# Patient Record
Sex: Female | Born: 1985 | Race: White | Hispanic: No | Marital: Married | Smoking: Never smoker
Health system: Southern US, Community
[De-identification: ages and names within clinical notes are randomized; demographics above are authoritative.]

## PROBLEM LIST (undated history)

## (undated) DIAGNOSIS — J309 Allergic rhinitis, unspecified: Secondary | ICD-10-CM

## (undated) DIAGNOSIS — E282 Polycystic ovarian syndrome: Secondary | ICD-10-CM

## (undated) DIAGNOSIS — H1010 Acute atopic conjunctivitis, unspecified eye: Secondary | ICD-10-CM

## (undated) DIAGNOSIS — J45909 Unspecified asthma, uncomplicated: Secondary | ICD-10-CM

## (undated) DIAGNOSIS — G43909 Migraine, unspecified, not intractable, without status migrainosus: Secondary | ICD-10-CM

## (undated) DIAGNOSIS — E079 Disorder of thyroid, unspecified: Secondary | ICD-10-CM

## (undated) HISTORY — DX: Allergic rhinitis, unspecified: J30.9

## (undated) HISTORY — PX: OTHER SURGICAL HISTORY: SHX169

## (undated) HISTORY — DX: Polycystic ovarian syndrome: E28.2

## (undated) HISTORY — DX: Unspecified asthma, uncomplicated: J45.909

## (undated) HISTORY — PX: WISDOM TOOTH EXTRACTION: SHX21

## (undated) HISTORY — DX: Migraine, unspecified, not intractable, without status migrainosus: G43.909

## (undated) HISTORY — DX: Acute atopic conjunctivitis, unspecified eye: H10.10

## (undated) HISTORY — DX: Disorder of thyroid, unspecified: E07.9

---

## 2015-04-08 ENCOUNTER — Ambulatory Visit: Payer: Self-pay | Admitting: Family Medicine

## 2016-04-01 DIAGNOSIS — Z01419 Encounter for gynecological examination (general) (routine) without abnormal findings: Secondary | ICD-10-CM | POA: Diagnosis not present

## 2016-04-20 ENCOUNTER — Encounter: Payer: Self-pay | Admitting: Allergy and Immunology

## 2016-04-20 ENCOUNTER — Ambulatory Visit (INDEPENDENT_AMBULATORY_CARE_PROVIDER_SITE_OTHER): Payer: BLUE CROSS/BLUE SHIELD | Admitting: Allergy and Immunology

## 2016-04-20 VITALS — BP 138/96 | HR 80 | Resp 18 | Ht 62.99 in | Wt 244.7 lb

## 2016-04-20 DIAGNOSIS — J309 Allergic rhinitis, unspecified: Secondary | ICD-10-CM | POA: Diagnosis not present

## 2016-04-20 DIAGNOSIS — H1010 Acute atopic conjunctivitis, unspecified eye: Secondary | ICD-10-CM

## 2016-04-20 DIAGNOSIS — J4540 Moderate persistent asthma, uncomplicated: Secondary | ICD-10-CM | POA: Diagnosis not present

## 2016-04-20 MED ORDER — MONTELUKAST SODIUM 10 MG PO TABS: 10.0000 mg | ORAL_TABLET | Freq: Every day | ORAL | Status: DC

## 2016-04-20 MED ORDER — BUDESONIDE-FORMOTEROL FUMARATE 160-4.5 MCG/ACT IN AERO: INHALATION_SPRAY | RESPIRATORY_TRACT | Status: DC

## 2016-04-20 MED ORDER — ALBUTEROL SULFATE 108 (90 BASE) MCG/ACT IN AEPB: 2.0000 | INHALATION_SPRAY | RESPIRATORY_TRACT | Status: DC | PRN

## 2016-04-20 NOTE — Patient Instructions (Addendum)
  1. Continue Symbicort 160 2 inhalations 1-2 times/ day depending on disease activity  2. Continue Flonase 1 spray each nostril 1-2 times/ day depending on disease activity  3. Continue montelukast 10 mg daily  4. Continue over-the-counter antihistamine, alaway eyedrops, and pro-air respiclick if needed  5. Return to clinic in 1 year or earlier if problem

## 2016-04-20 NOTE — Progress Notes (Signed)
Follow-up Note  Referring Provider: No ref. provider found Primary Provider: Frederich Chick, MD Date of Office Visit: 04/20/2016  Subjective:   Katherine Crawford (DOB: 31-Jul-1986) is a 30 y.o. female who [facility] in re-evaluation of the following:  HPI: Tanisa returns to this clinic in reevaluation of her asthma and allergic rhinoconjunctivitis. It is been approximately 1 year since I've seen her in his clinic.  Her asthma is been doing quite well although she's noticed a little bit more activity with some wheezing and coughing and having use of bronchodilator less than 1 time per week since the spring has arrived. Unfortunately, she ran out of her Symbicort recently. Her nose is been doing okay as long she continues to use Flonase and montelukast on a pretty consistent basis. She does use over-the-counter eyedrops and over-the-counter antihistamines and her entire collection of medical therapy appears to be resulting in adequate control of her atopic disease as she goes through the year. She has not required any systemic steroids to treat this condition nor any type of urgent care evaluation.     Medication List           ALAWAY OP  Apply 1 drop to eye daily.     Albuterol Sulfate 108 (90 Base) MCG/ACT Aepb  Commonly known as:  PROAIR RESPICLICK  Inhale 2 Doses into the lungs as needed. Every 4 to 6 hours for cough or wheeze.     ALLEGRA PO  Take by mouth daily.     budesonide-formoterol 160-4.5 MCG/ACT inhaler  Commonly known as:  SYMBICORT  Inhale two puffs twice daily to prevent cough or wheeze.  Rinse, gargle, and spit after use.     fluticasone 50 MCG/ACT nasal spray  Commonly known as:  FLONASE  Place 2 sprays into both nostrils daily.     montelukast 10 MG tablet  Commonly known as:  SINGULAIR  Take 1 tablet (10 mg total) by mouth daily.     multivitamin tablet  Take 1 tablet by mouth daily.     OCELLA 3-0.03 MG  tablet  Generic drug:  drospirenone-ethinyl estradiol  Take 1 tablet by mouth daily.     SUMATRIPTAN SUCCINATE PO  Take by mouth as needed.        Past Medical History  Diagnosis Date  . Asthma   . Allergic rhinoconjunctivitis   . Migraines     Past Surgical History  Procedure Laterality Date  . Wisdom tooth extraction      No Known Allergies  Review of systems negative except as noted in HPI / PMHx or noted below:  Review of Systems  Constitutional: Negative.   HENT: Negative.   Eyes: Negative.   Respiratory: Negative.   Cardiovascular: Negative.   Gastrointestinal: Negative.   Genitourinary: Negative.   Musculoskeletal: Negative.   Skin: Negative.   Neurological: Negative.   Endo/Heme/Allergies: Negative.   Psychiatric/Behavioral: Negative.      Objective:   Filed Vitals:   04/20/16 1810  BP: 138/96  Pulse: 80  Resp: 18   Height: 5' 2.99" (160 cm)  Weight: 244 lb 11.4 oz (111 kg)   Physical Exam  Constitutional: She is well-developed, well-nourished, and in no distress.  HENT:  Head: Normocephalic.  Right Ear: Tympanic membrane, external ear and ear canal normal.  Left Ear: Tympanic membrane, external ear and ear canal normal.  Nose: Nose normal. No mucosal edema or rhinorrhea.  Mouth/Throat: Uvula is  midline, oropharynx is clear and moist and mucous membranes are normal. No oropharyngeal exudate.  Eyes: Conjunctivae are normal.  Neck: Trachea normal. No tracheal tenderness present. No tracheal deviation present. No thyromegaly present.  Cardiovascular: Normal rate, regular rhythm, S1 normal, S2 normal and normal heart sounds.   No murmur heard. Pulmonary/Chest: Breath sounds normal. No stridor. No respiratory distress. She has no wheezes. She has no rales.  Musculoskeletal: She exhibits no edema.  Lymphadenopathy:       Head (right side): No tonsillar adenopathy present.       Head (left side): No tonsillar adenopathy present.    She has no  cervical adenopathy.  Neurological: She is alert. Gait normal.  Skin: No rash noted. She is not diaphoretic. No erythema. Nails show no clubbing.  Psychiatric: Mood and affect normal.    Diagnostics:    Spirometry was performed and demonstrated an FEV1 of 2.85 at 91 % of predicted.  The patient had an Asthma Control Test with the following results: ACT Total Score: 13.    Assessment and Plan:   1. Asthma, moderate persistent, well-controlled   2. Allergic rhinoconjunctivitis     1. Continue Symbicort 160 2 inhalations 1-2 times/ day depending on disease activity  2. Continue Flonase 1 spray each nostril 1-2 times/ day depending on disease activity  3. Continue montelukast 10 mg daily  4. Continue over-the-counter antihistamine, alaway eyedrops, and pro-air respiclick if needed  5. Return to clinic in 1 year or earlier if problem  Diannia RuderKara appears to be doing well on her medical plan and I see no need for changing this plan at this point in time. She is a very good understanding of her disease state and understands when it is appropriate to use specific medications and I will see her back in this clinic in approximately one year or earlier if there is a problem with this plan.  Laurette SchimkeEric Kozlow, MD [facility]

## 2016-06-07 DIAGNOSIS — R202 Paresthesia of skin: Secondary | ICD-10-CM | POA: Diagnosis not present

## 2016-06-07 DIAGNOSIS — Z7189 Other specified counseling: Secondary | ICD-10-CM | POA: Diagnosis not present

## 2016-08-09 DIAGNOSIS — Z3041 Encounter for surveillance of contraceptive pills: Secondary | ICD-10-CM | POA: Diagnosis not present

## 2016-08-09 DIAGNOSIS — G43709 Chronic migraine without aura, not intractable, without status migrainosus: Secondary | ICD-10-CM | POA: Diagnosis not present

## 2016-11-26 DIAGNOSIS — N898 Other specified noninflammatory disorders of vagina: Secondary | ICD-10-CM | POA: Diagnosis not present

## 2016-11-26 DIAGNOSIS — Z0000 Encounter for general adult medical examination without abnormal findings: Secondary | ICD-10-CM | POA: Diagnosis not present

## 2017-04-15 ENCOUNTER — Other Ambulatory Visit: Payer: Self-pay | Admitting: Obstetrics & Gynecology

## 2017-04-15 ENCOUNTER — Other Ambulatory Visit (HOSPITAL_COMMUNITY)
Admission: RE | Admit: 2017-04-15 | Discharge: 2017-04-15 | Disposition: A | Discharge status: Home / Self Care | Payer: BLUE CROSS/BLUE SHIELD | Source: Ambulatory Visit | Attending: Obstetrics and Gynecology | Admitting: Obstetrics and Gynecology

## 2017-04-15 DIAGNOSIS — Z01419 Encounter for gynecological examination (general) (routine) without abnormal findings: Secondary | ICD-10-CM | POA: Diagnosis not present

## 2017-04-15 DIAGNOSIS — Z01411 Encounter for gynecological examination (general) (routine) with abnormal findings: Secondary | ICD-10-CM | POA: Diagnosis not present

## 2017-04-18 LAB — CYTOLOGY - PAP: DIAGNOSIS: NEGATIVE

## 2017-08-11 DIAGNOSIS — L700 Acne vulgaris: Secondary | ICD-10-CM | POA: Diagnosis not present

## 2017-08-11 DIAGNOSIS — D220 Melanocytic nevi of lip: Secondary | ICD-10-CM | POA: Diagnosis not present

## 2017-08-11 DIAGNOSIS — L219 Seborrheic dermatitis, unspecified: Secondary | ICD-10-CM | POA: Diagnosis not present

## 2017-08-11 DIAGNOSIS — D2262 Melanocytic nevi of left upper limb, including shoulder: Secondary | ICD-10-CM | POA: Diagnosis not present

## 2017-09-28 DIAGNOSIS — S46811A Strain of other muscles, fascia and tendons at shoulder and upper arm level, right arm, initial encounter: Secondary | ICD-10-CM | POA: Diagnosis not present

## 2017-10-26 ENCOUNTER — Ambulatory Visit: Payer: BLUE CROSS/BLUE SHIELD | Admitting: Podiatry

## 2017-10-26 ENCOUNTER — Encounter: Payer: Self-pay | Admitting: Podiatry

## 2017-10-26 ENCOUNTER — Ambulatory Visit (INDEPENDENT_AMBULATORY_CARE_PROVIDER_SITE_OTHER): Payer: BLUE CROSS/BLUE SHIELD

## 2017-10-26 ENCOUNTER — Ambulatory Visit: Payer: Self-pay

## 2017-10-26 VITALS — BP 118/85 | HR 98 | Ht 64.0 in | Wt 250.0 lb

## 2017-10-26 DIAGNOSIS — M659 Synovitis and tenosynovitis, unspecified: Secondary | ICD-10-CM

## 2017-10-26 DIAGNOSIS — R269 Unspecified abnormalities of gait and mobility: Secondary | ICD-10-CM | POA: Diagnosis not present

## 2017-10-26 DIAGNOSIS — S93622S Sprain of tarsometatarsal ligament of left foot, sequela: Secondary | ICD-10-CM | POA: Diagnosis not present

## 2017-10-26 MED ORDER — MELOXICAM 15 MG PO TABS: 15.0000 mg | ORAL_TABLET | Freq: Every day | ORAL | 0 refills | Status: DC

## 2017-10-26 NOTE — Progress Notes (Signed)
Subjective:    Patient ID: Katherine Crawford, female    DOB: Dec 17, 1986, 31 y.o.   MRN: [id number]  HPI  Chief Complaint  Patient presents with  . Foot Pain    left foot x 3 weeks - pain is across top of foot - worse walking up hill or up stairs. Pain is tight , like someone is squeezing her foot.Right foot has started hurting the last few days, heel and achilles tendon area. Likely due to they way she is walking because of the left foot pain   31 y.o. female presents with the above complaint.  Ports pain across the top of left foot states that it is worse when walking up a hill or stairs.  Denies known injury.   Past Medical History:  Diagnosis Date  . Allergic rhinoconjunctivitis   . Asthma   . Migraines    Past Surgical History:  Procedure Laterality Date  . WISDOM TOOTH EXTRACTION      Current Outpatient Medications:  .  ACZONE 7.5 % GEL, APPLY TO THE AFFECTED AREA EVERY DAY, Disp: , Rfl: 3 .  Albuterol Sulfate (PROAIR RESPICLICK) 108 (90 Base) MCG/ACT AEPB, Inhale 2 Doses into the lungs as needed. Every 4 to 6 hours for cough or wheeze., Disp: 3 each, Rfl: 0 .  budesonide-formoterol (SYMBICORT) 160-4.5 MCG/ACT inhaler, Inhale two puffs twice daily to prevent cough or wheeze.  Rinse, gargle, and spit after use., Disp: 3 Inhaler, Rfl: 1 .  Fexofenadine HCl (ALLEGRA PO), Take by mouth daily., Disp: , Rfl:  .  fluticasone (FLONASE) 50 MCG/ACT nasal spray, Place 2 sprays into both nostrils daily., Disp: , Rfl:  .  ketoconazole (NIZORAL) 2 % shampoo, , Disp: , Rfl:  .  Ketotifen Fumarate (ALAWAY OP), Apply 1 drop to eye daily., Disp: , Rfl:  .  levonorgestrel-ethinyl estradiol (SEASONALE,INTROVALE,JOLESSA) 0.15-0.03 MG tablet, TAKE 1 TABLET BY MOUTH EVERY DAY FOR 11 WEEKS, THEN 1 WEEK OFF, Disp: , Rfl: 4 .  meloxicam (MOBIC) 15 MG tablet, Take 1 tablet (15 mg total) daily by mouth., Disp: 30 tablet, Rfl: 0 .  montelukast (SINGULAIR) 10 MG tablet, Take 1 tablet (10 mg total) by mouth  daily., Disp: 90 tablet, Rfl: 1 .  Multiple Vitamin (MULTIVITAMIN) tablet, Take 1 tablet by mouth daily., Disp: , Rfl:  .  OCELLA 3-0.03 MG tablet, Take 1 tablet by mouth daily. , Disp: , Rfl:  .  SUMATRIPTAN SUCCINATE PO, Take by mouth as needed., Disp: , Rfl:   No Known Allergies    Review of Systems  All other systems reviewed and are negative.      Objective:   Physical Exam Vitals:   10/26/17 0939  BP: 118/85  Pulse: 98   General AA&O x3. Normal mood and affect.  Vascular Dorsalis pedis and posterior tibial pulses  present 2+ bilaterally  Capillary refill normal to all digits. Pedal hair growth normal.  Neurologic Epicritic sensation grossly present.  Dermatologic No open lesions. Interspaces clear of maceration. Nails well groomed and normal in appearance.  Orthopedic: MMT 5/5 in dorsiflexion, plantarflexion, inversion, and eversion. Normal joint ROM without pain or crepitus. Slight pain to palpation left 2nd/3rd TMT's.  No pain with piano key test of the TMT's   Radiographs taken and reviewed: Questionable evidence of Lisfranc diastases likely not acute    Assessment & Plan:  Patient was evaluated and treated and all questions answered  ?Lisfranc Sprain -X-rays reviewed as above -Will trial Cam boot for protection -Should  pain not approved will consider repeat x-rays

## 2017-10-27 ENCOUNTER — Other Ambulatory Visit: Payer: Self-pay | Admitting: Podiatry

## 2017-10-27 DIAGNOSIS — M659 Synovitis and tenosynovitis, unspecified: Secondary | ICD-10-CM

## 2017-11-09 ENCOUNTER — Ambulatory Visit: Payer: BLUE CROSS/BLUE SHIELD | Admitting: Podiatry

## 2017-11-09 DIAGNOSIS — R269 Unspecified abnormalities of gait and mobility: Secondary | ICD-10-CM | POA: Diagnosis not present

## 2017-11-09 DIAGNOSIS — M25372 Other instability, left ankle: Secondary | ICD-10-CM | POA: Diagnosis not present

## 2017-11-09 DIAGNOSIS — S93622S Sprain of tarsometatarsal ligament of left foot, sequela: Secondary | ICD-10-CM

## 2017-11-09 NOTE — Progress Notes (Signed)
  Subjective:  Patient ID: Katherine PintoKara Bogdan, female    DOB: May 15, 1986,  MRN: [id number]  31 y.o. female for follow-up left foot pain.  Wear the cam boot states that it is somewhat better she is not still at 100%  Objective:  There were no vitals filed for this visit. General AA&O x3. Normal mood and affect.  Vascular Pedal pulses palpable.  Neurologic Epicritic sensation grossly intact.  Dermatologic No open lesions. Skin normal texture and turgor.  Orthopedic: No pain to palpation to L foot   Assessment & Plan:  Patient was evaluated and treated and all questions answered.  ? L Lisfranc/Ankle insufficiency -Improving.  We will transition out of cam boot. -Transition to lace up ASO for protection -Follow-up in 4 weeks for new x-rays

## 2017-11-18 ENCOUNTER — Telehealth: Payer: Self-pay | Admitting: Podiatry

## 2017-11-18 NOTE — Telephone Encounter (Signed)
About a week to a week and a half ago Dr. Samuella CotaPrice took me out of the boot and put me into a brace. My foot feels like it is hurting more now than before. I wanted to know what he would recommend. My number is [phone]. Thank you.

## 2017-11-21 NOTE — Telephone Encounter (Signed)
Spoke with patient informing her to go back into her boot until further evaluation

## 2017-11-21 NOTE — Telephone Encounter (Signed)
Go back in the boot until next eval.

## 2017-11-22 ENCOUNTER — Other Ambulatory Visit: Payer: Self-pay | Admitting: Podiatry

## 2017-11-25 ENCOUNTER — Ambulatory Visit (INDEPENDENT_AMBULATORY_CARE_PROVIDER_SITE_OTHER): Payer: BLUE CROSS/BLUE SHIELD

## 2017-11-25 ENCOUNTER — Ambulatory Visit: Payer: BLUE CROSS/BLUE SHIELD | Admitting: Podiatry

## 2017-11-25 DIAGNOSIS — S93622S Sprain of tarsometatarsal ligament of left foot, sequela: Secondary | ICD-10-CM | POA: Diagnosis not present

## 2017-11-25 MED ORDER — METHYLPREDNISOLONE 4 MG PO TBPK: ORAL_TABLET | ORAL | 0 refills | Status: DC

## 2017-11-25 NOTE — Progress Notes (Signed)
  Subjective:  Patient ID: Katherine Crawford, female    DOB: Mar 08, 1986,  MRN: [id number]  No chief complaint on file.  31 y.o. female returns for follow-up right Lisfranc sprain; states that her pain is improving.  Objective:  There were no vitals filed for this visit. General AA&O x3. Normal mood and affect.  Vascular Pedal pulses palpable.  Neurologic Epicritic sensation grossly intact.  Dermatologic No open lesions. Skin normal texture and turgor.  Orthopedic: No pain to palpation either foot.   Assessment & Plan:  Patient was evaluated and treated and all questions answered.  Right Lisfranc sprain -X-ray is taken and reviewed no worsening Lisfranc diastases -Rx Medrol pack -Patient advised to continue ASO for protection during periods of extended weightbearing.  Otherwise can transition out to patient tolerance  Return in about 6 weeks (around 01/06/2018) for sprain f/u.

## 2017-12-07 ENCOUNTER — Telehealth: Payer: Self-pay | Admitting: *Deleted

## 2017-12-07 DIAGNOSIS — I10 Essential (primary) hypertension: Secondary | ICD-10-CM | POA: Diagnosis not present

## 2017-12-07 DIAGNOSIS — Z23 Encounter for immunization: Secondary | ICD-10-CM | POA: Diagnosis not present

## 2017-12-07 DIAGNOSIS — Z0000 Encounter for general adult medical examination without abnormal findings: Secondary | ICD-10-CM | POA: Diagnosis not present

## 2017-12-07 DIAGNOSIS — Z6841 Body Mass Index (BMI) 40.0 and over, adult: Secondary | ICD-10-CM | POA: Diagnosis not present

## 2017-12-07 DIAGNOSIS — E781 Pure hyperglyceridemia: Secondary | ICD-10-CM | POA: Diagnosis not present

## 2017-12-07 MED ORDER — MELOXICAM 15 MG PO TABS: 15.0000 mg | ORAL_TABLET | Freq: Every day | ORAL | 0 refills | Status: DC

## 2017-12-07 NOTE — Telephone Encounter (Signed)
Received refill request for meloxicam. Dr. Samuella CotaPrice states refill once and pt needs to be seen prior to future refills.

## 2018-01-05 ENCOUNTER — Ambulatory Visit: Payer: BLUE CROSS/BLUE SHIELD | Admitting: Podiatry

## 2018-01-05 DIAGNOSIS — M25372 Other instability, left ankle: Secondary | ICD-10-CM | POA: Diagnosis not present

## 2018-01-05 DIAGNOSIS — S93622S Sprain of tarsometatarsal ligament of left foot, sequela: Secondary | ICD-10-CM

## 2018-01-05 NOTE — Progress Notes (Signed)
  Subjective:  Patient ID: Erma PintoKara Krabbenhoft, female    DOB: 1986-06-26,  MRN: [id number]  No chief complaint on file.  32 y.o. female returns for f/u L foot sprain. Doing well. Able to ambulate in normal shoegear without issue. States she is having no pain but her foot doesn't feel exactly back to normal.  Objective:  There were no vitals filed for this visit. General AA&O x3. Normal mood and affect.  Vascular Pedal pulses palpable.  Neurologic Epicritic sensation grossly intact.  Dermatologic No open lesions. Skin normal texture and turgor.  Orthopedic: No pain to palpation either foot.   Assessment & Plan:  Patient was evaluated and treated and all questions answered.  S/p Lisfranc Pain -No longer having pain. -Advised on strengthening exercises for the L foot. Should issues persist advised we could trial short course of PT. Patient declined at this time.  No Follow-up on file.

## 2018-03-22 DIAGNOSIS — G43009 Migraine without aura, not intractable, without status migrainosus: Secondary | ICD-10-CM | POA: Diagnosis not present

## 2018-04-19 DIAGNOSIS — L700 Acne vulgaris: Secondary | ICD-10-CM | POA: Diagnosis not present

## 2018-04-19 DIAGNOSIS — D1801 Hemangioma of skin and subcutaneous tissue: Secondary | ICD-10-CM | POA: Diagnosis not present

## 2018-04-19 DIAGNOSIS — L814 Other melanin hyperpigmentation: Secondary | ICD-10-CM | POA: Diagnosis not present

## 2018-04-26 DIAGNOSIS — Z3009 Encounter for other general counseling and advice on contraception: Secondary | ICD-10-CM | POA: Diagnosis not present

## 2018-04-26 DIAGNOSIS — Z1379 Encounter for other screening for genetic and chromosomal anomalies: Secondary | ICD-10-CM | POA: Diagnosis not present

## 2018-04-26 DIAGNOSIS — Z01411 Encounter for gynecological examination (general) (routine) with abnormal findings: Secondary | ICD-10-CM | POA: Diagnosis not present

## 2018-04-26 DIAGNOSIS — E282 Polycystic ovarian syndrome: Secondary | ICD-10-CM | POA: Diagnosis not present

## 2018-05-10 DIAGNOSIS — Z0100 Encounter for examination of eyes and vision without abnormal findings: Secondary | ICD-10-CM | POA: Diagnosis not present

## 2018-05-17 DIAGNOSIS — Z31430 Encounter of female for testing for genetic disease carrier status for procreative management: Secondary | ICD-10-CM | POA: Diagnosis not present

## 2018-06-16 ENCOUNTER — Encounter (HOSPITAL_COMMUNITY): Payer: BLUE CROSS/BLUE SHIELD

## 2018-06-30 ENCOUNTER — Encounter (HOSPITAL_COMMUNITY): Payer: BLUE CROSS/BLUE SHIELD

## 2018-07-20 DIAGNOSIS — N979 Female infertility, unspecified: Secondary | ICD-10-CM | POA: Diagnosis not present

## 2018-07-20 DIAGNOSIS — Z6841 Body Mass Index (BMI) 40.0 and over, adult: Secondary | ICD-10-CM | POA: Diagnosis not present

## 2018-07-20 DIAGNOSIS — Z3141 Encounter for fertility testing: Secondary | ICD-10-CM | POA: Diagnosis not present

## 2018-07-21 ENCOUNTER — Ambulatory Visit (HOSPITAL_COMMUNITY): Payer: BLUE CROSS/BLUE SHIELD

## 2018-07-21 ENCOUNTER — Encounter (HOSPITAL_COMMUNITY): Payer: BLUE CROSS/BLUE SHIELD

## 2018-07-28 DIAGNOSIS — K648 Other hemorrhoids: Secondary | ICD-10-CM | POA: Diagnosis not present

## 2018-08-11 DIAGNOSIS — Z3141 Encounter for fertility testing: Secondary | ICD-10-CM | POA: Diagnosis not present

## 2018-08-14 DIAGNOSIS — Z3141 Encounter for fertility testing: Secondary | ICD-10-CM | POA: Diagnosis not present

## 2018-09-11 DIAGNOSIS — Z3141 Encounter for fertility testing: Secondary | ICD-10-CM | POA: Diagnosis not present

## 2018-10-10 DIAGNOSIS — Z3141 Encounter for fertility testing: Secondary | ICD-10-CM | POA: Diagnosis not present

## 2018-11-02 DIAGNOSIS — R300 Dysuria: Secondary | ICD-10-CM | POA: Diagnosis not present

## 2018-11-06 DIAGNOSIS — Z3141 Encounter for fertility testing: Secondary | ICD-10-CM | POA: Diagnosis not present

## 2018-11-08 DIAGNOSIS — N970 Female infertility associated with anovulation: Secondary | ICD-10-CM | POA: Diagnosis not present

## 2018-12-01 DIAGNOSIS — Z3141 Encounter for fertility testing: Secondary | ICD-10-CM | POA: Diagnosis not present

## 2018-12-06 DIAGNOSIS — Z3141 Encounter for fertility testing: Secondary | ICD-10-CM | POA: Diagnosis not present

## 2018-12-08 DIAGNOSIS — Z0000 Encounter for general adult medical examination without abnormal findings: Secondary | ICD-10-CM | POA: Diagnosis not present

## 2018-12-08 DIAGNOSIS — Z3189 Encounter for other procreative management: Secondary | ICD-10-CM | POA: Diagnosis not present

## 2018-12-21 DIAGNOSIS — J111 Influenza due to unidentified influenza virus with other respiratory manifestations: Secondary | ICD-10-CM | POA: Diagnosis not present

## 2018-12-21 DIAGNOSIS — R509 Fever, unspecified: Secondary | ICD-10-CM | POA: Diagnosis not present

## 2019-02-14 DIAGNOSIS — Z3141 Encounter for fertility testing: Secondary | ICD-10-CM | POA: Diagnosis not present

## 2019-02-16 DIAGNOSIS — Z3189 Encounter for other procreative management: Secondary | ICD-10-CM | POA: Diagnosis not present

## 2019-04-25 DIAGNOSIS — D2262 Melanocytic nevi of left upper limb, including shoulder: Secondary | ICD-10-CM | POA: Diagnosis not present

## 2019-04-25 DIAGNOSIS — L814 Other melanin hyperpigmentation: Secondary | ICD-10-CM | POA: Diagnosis not present

## 2019-04-25 DIAGNOSIS — D225 Melanocytic nevi of trunk: Secondary | ICD-10-CM | POA: Diagnosis not present

## 2019-04-25 DIAGNOSIS — D220 Melanocytic nevi of lip: Secondary | ICD-10-CM | POA: Diagnosis not present

## 2019-04-25 DIAGNOSIS — D485 Neoplasm of uncertain behavior of skin: Secondary | ICD-10-CM | POA: Diagnosis not present

## 2019-05-15 DIAGNOSIS — Z1159 Encounter for screening for other viral diseases: Secondary | ICD-10-CM | POA: Diagnosis not present

## 2019-05-18 DIAGNOSIS — Z3141 Encounter for fertility testing: Secondary | ICD-10-CM | POA: Diagnosis not present

## 2019-05-21 DIAGNOSIS — Z3141 Encounter for fertility testing: Secondary | ICD-10-CM | POA: Diagnosis not present

## 2019-05-23 DIAGNOSIS — Z3141 Encounter for fertility testing: Secondary | ICD-10-CM | POA: Diagnosis not present

## 2019-05-23 DIAGNOSIS — Z3183 Encounter for assisted reproductive fertility procedure cycle: Secondary | ICD-10-CM | POA: Diagnosis not present

## 2019-05-25 DIAGNOSIS — Z3141 Encounter for fertility testing: Secondary | ICD-10-CM | POA: Diagnosis not present

## 2019-05-28 DIAGNOSIS — Z3141 Encounter for fertility testing: Secondary | ICD-10-CM | POA: Diagnosis not present

## 2019-05-30 DIAGNOSIS — Z3141 Encounter for fertility testing: Secondary | ICD-10-CM | POA: Diagnosis not present

## 2019-05-31 DIAGNOSIS — N979 Female infertility, unspecified: Secondary | ICD-10-CM | POA: Diagnosis not present

## 2019-06-05 DIAGNOSIS — N979 Female infertility, unspecified: Secondary | ICD-10-CM | POA: Diagnosis not present

## 2019-06-15 DIAGNOSIS — N979 Female infertility, unspecified: Secondary | ICD-10-CM | POA: Diagnosis not present

## 2019-06-26 DIAGNOSIS — Z3141 Encounter for fertility testing: Secondary | ICD-10-CM | POA: Diagnosis not present

## 2019-07-02 DIAGNOSIS — N979 Female infertility, unspecified: Secondary | ICD-10-CM | POA: Diagnosis not present

## 2019-07-02 DIAGNOSIS — Z3183 Encounter for assisted reproductive fertility procedure cycle: Secondary | ICD-10-CM | POA: Diagnosis not present

## 2019-07-10 DIAGNOSIS — Z3200 Encounter for pregnancy test, result unknown: Secondary | ICD-10-CM | POA: Diagnosis not present

## 2019-07-11 DIAGNOSIS — Z3490 Encounter for supervision of normal pregnancy, unspecified, unspecified trimester: Secondary | ICD-10-CM | POA: Diagnosis not present

## 2019-07-11 DIAGNOSIS — O09819 Supervision of pregnancy resulting from assisted reproductive technology, unspecified trimester: Secondary | ICD-10-CM | POA: Diagnosis not present

## 2019-07-12 DIAGNOSIS — Z3200 Encounter for pregnancy test, result unknown: Secondary | ICD-10-CM | POA: Diagnosis not present

## 2019-07-16 DIAGNOSIS — Z3200 Encounter for pregnancy test, result unknown: Secondary | ICD-10-CM | POA: Diagnosis not present

## 2019-08-02 DIAGNOSIS — Z3A00 Weeks of gestation of pregnancy not specified: Secondary | ICD-10-CM | POA: Diagnosis not present

## 2019-08-02 DIAGNOSIS — O09811 Supervision of pregnancy resulting from assisted reproductive technology, first trimester: Secondary | ICD-10-CM | POA: Diagnosis not present

## 2019-08-10 LAB — OB RESULTS CONSOLE GC/CHLAMYDIA
Chlamydia: NEGATIVE
Gonorrhea: NEGATIVE

## 2019-08-10 LAB — OB RESULTS CONSOLE RPR: RPR: NONREACTIVE

## 2019-08-10 LAB — OB RESULTS CONSOLE RUBELLA ANTIBODY, IGM: Rubella: NON-IMMUNE/NOT IMMUNE

## 2019-08-10 LAB — OB RESULTS CONSOLE ANTIBODY SCREEN: Antibody Screen: NEGATIVE

## 2019-08-10 LAB — OB RESULTS CONSOLE ABO/RH: RH Type: POSITIVE

## 2019-08-10 LAB — OB RESULTS CONSOLE HIV ANTIBODY (ROUTINE TESTING): HIV: NONREACTIVE

## 2019-08-10 LAB — OB RESULTS CONSOLE HEPATITIS B SURFACE ANTIGEN: Hepatitis B Surface Ag: NEGATIVE

## 2019-08-17 DIAGNOSIS — Z3400 Encounter for supervision of normal first pregnancy, unspecified trimester: Secondary | ICD-10-CM | POA: Diagnosis not present

## 2019-08-17 DIAGNOSIS — Z3201 Encounter for pregnancy test, result positive: Secondary | ICD-10-CM | POA: Diagnosis not present

## 2019-08-24 DIAGNOSIS — Z3401 Encounter for supervision of normal first pregnancy, first trimester: Secondary | ICD-10-CM | POA: Diagnosis not present

## 2019-10-19 DIAGNOSIS — E039 Hypothyroidism, unspecified: Secondary | ICD-10-CM | POA: Diagnosis not present

## 2019-10-19 DIAGNOSIS — O09812 Supervision of pregnancy resulting from assisted reproductive technology, second trimester: Secondary | ICD-10-CM | POA: Diagnosis not present

## 2019-10-19 DIAGNOSIS — Z3402 Encounter for supervision of normal first pregnancy, second trimester: Secondary | ICD-10-CM | POA: Diagnosis not present

## 2019-11-01 DIAGNOSIS — O99212 Obesity complicating pregnancy, second trimester: Secondary | ICD-10-CM | POA: Diagnosis not present

## 2019-11-01 DIAGNOSIS — Z36 Encounter for antenatal screening for chromosomal anomalies: Secondary | ICD-10-CM | POA: Diagnosis not present

## 2019-11-01 DIAGNOSIS — Z3402 Encounter for supervision of normal first pregnancy, second trimester: Secondary | ICD-10-CM | POA: Diagnosis not present

## 2019-11-11 DIAGNOSIS — Z20828 Contact with and (suspected) exposure to other viral communicable diseases: Secondary | ICD-10-CM | POA: Diagnosis not present

## 2019-11-28 ENCOUNTER — Other Ambulatory Visit (HOSPITAL_COMMUNITY): Payer: Self-pay | Admitting: Obstetrics & Gynecology

## 2019-11-28 ENCOUNTER — Encounter (HOSPITAL_COMMUNITY): Payer: Self-pay | Admitting: *Deleted

## 2019-11-28 DIAGNOSIS — Z3A24 24 weeks gestation of pregnancy: Secondary | ICD-10-CM

## 2019-11-28 DIAGNOSIS — Z3689 Encounter for other specified antenatal screening: Secondary | ICD-10-CM

## 2019-11-28 DIAGNOSIS — O99212 Obesity complicating pregnancy, second trimester: Secondary | ICD-10-CM

## 2019-11-30 ENCOUNTER — Encounter (HOSPITAL_COMMUNITY): Payer: Self-pay

## 2019-11-30 ENCOUNTER — Other Ambulatory Visit: Payer: Self-pay

## 2019-11-30 ENCOUNTER — Ambulatory Visit (HOSPITAL_COMMUNITY): Payer: BLUE CROSS/BLUE SHIELD | Admitting: *Deleted

## 2019-11-30 ENCOUNTER — Ambulatory Visit (HOSPITAL_COMMUNITY)
Admission: RE | Admit: 2019-11-30 | Discharge: 2019-11-30 | Disposition: A | Discharge status: Home / Self Care | Payer: BLUE CROSS/BLUE SHIELD | Source: Ambulatory Visit | Attending: Obstetrics and Gynecology | Admitting: Obstetrics and Gynecology

## 2019-11-30 VITALS — BP 121/78 | HR 93 | Temp 96.9°F

## 2019-11-30 DIAGNOSIS — O0990 Supervision of high risk pregnancy, unspecified, unspecified trimester: Secondary | ICD-10-CM | POA: Diagnosis not present

## 2019-11-30 DIAGNOSIS — O99282 Endocrine, nutritional and metabolic diseases complicating pregnancy, second trimester: Secondary | ICD-10-CM | POA: Diagnosis not present

## 2019-11-30 DIAGNOSIS — E039 Hypothyroidism, unspecified: Secondary | ICD-10-CM

## 2019-11-30 DIAGNOSIS — O99212 Obesity complicating pregnancy, second trimester: Secondary | ICD-10-CM

## 2019-11-30 DIAGNOSIS — O2692 Pregnancy related conditions, unspecified, second trimester: Secondary | ICD-10-CM

## 2019-11-30 DIAGNOSIS — Z3A24 24 weeks gestation of pregnancy: Secondary | ICD-10-CM

## 2019-11-30 DIAGNOSIS — Z3689 Encounter for other specified antenatal screening: Secondary | ICD-10-CM | POA: Diagnosis not present

## 2019-11-30 DIAGNOSIS — O09812 Supervision of pregnancy resulting from assisted reproductive technology, second trimester: Secondary | ICD-10-CM | POA: Diagnosis not present

## 2019-11-30 IMAGING — US US MFM OB DETAIL+14 WK
1 series · 13 of 28 positions shown · non-contrast
Comparison: none

[Series 1: us mfm ob detail+14 wk · 121 acquisitions, 13 frames shown]
[im 5/121]
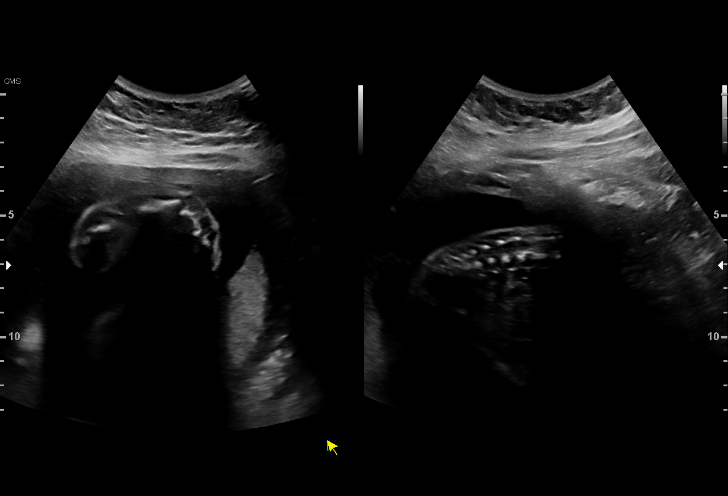
[im 14/121]
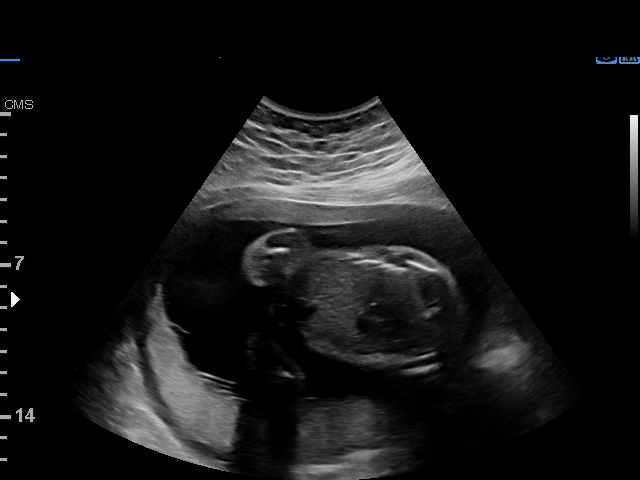
[im 23/121]
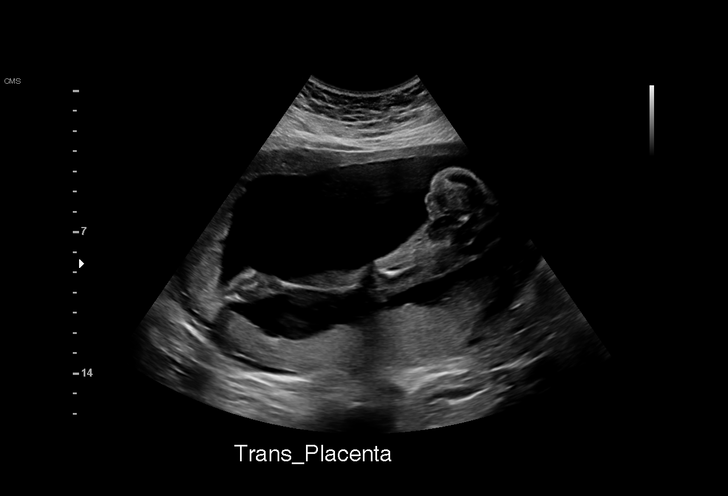
[im 32/121]
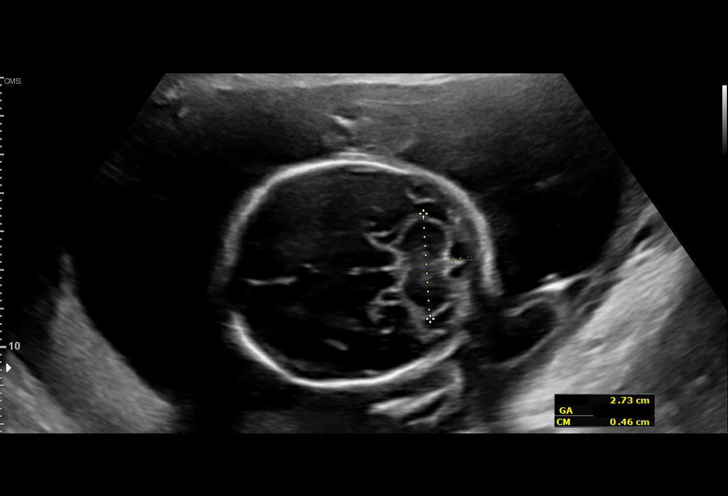
[im 41/121]
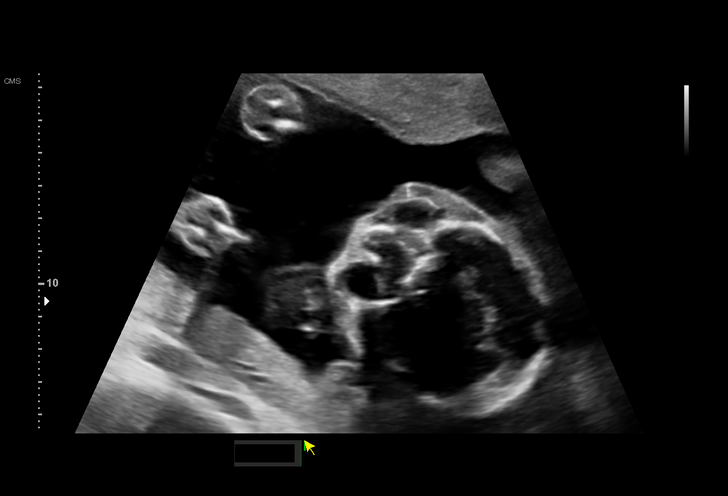
[im 49/121]
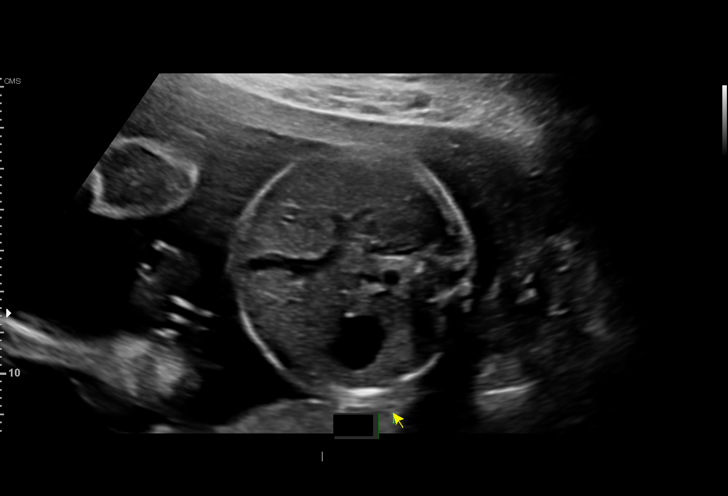
[im 63/121]
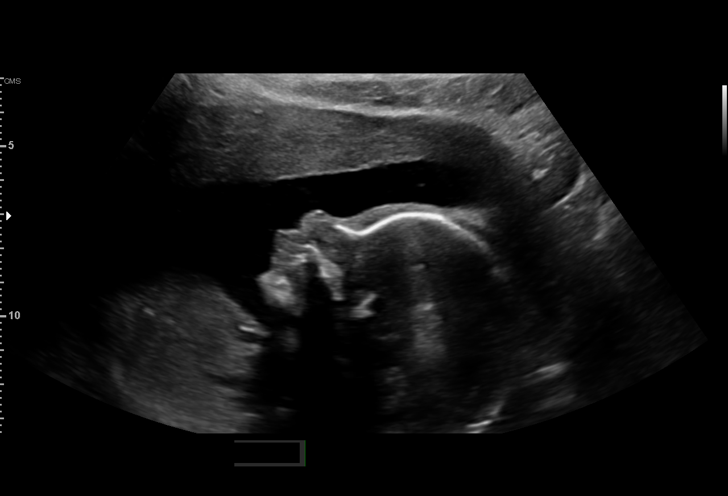
[im 72/121]
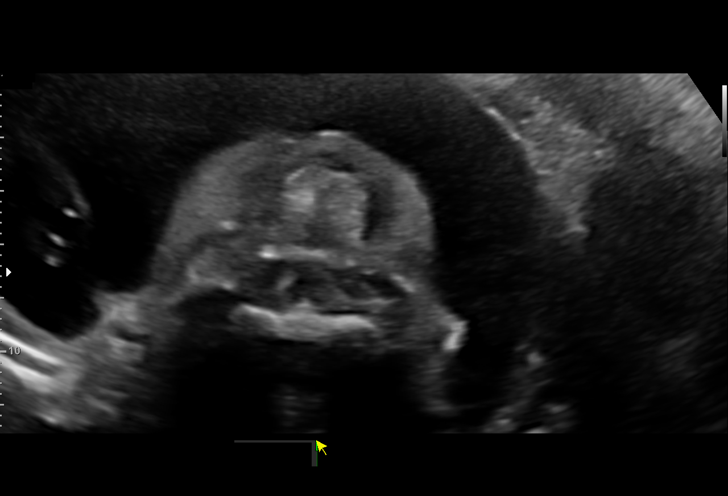
[im 81/121]
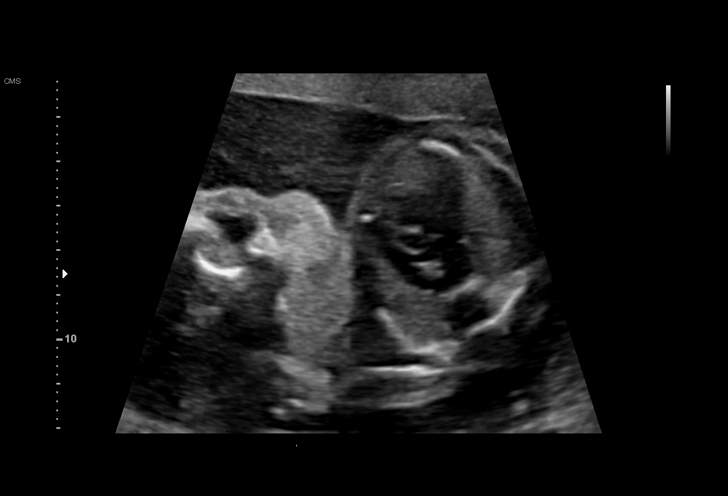
[im 89/121]
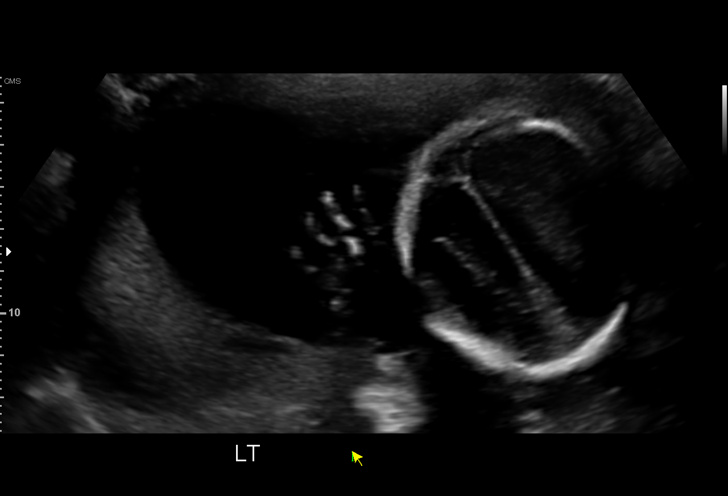
[im 98/121]
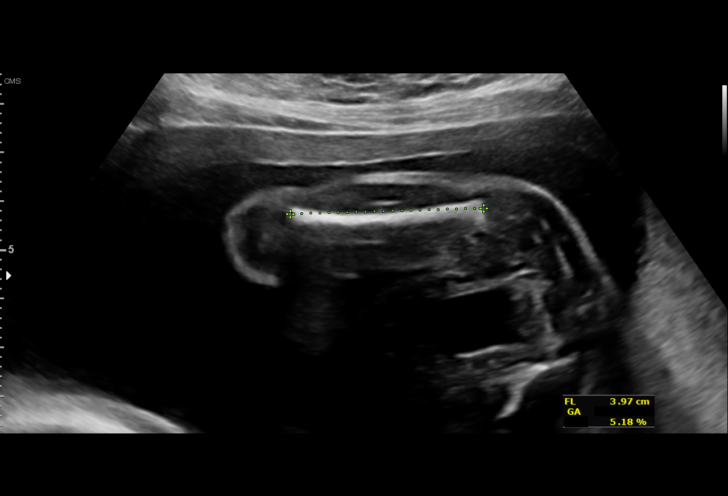
[im 107/121]
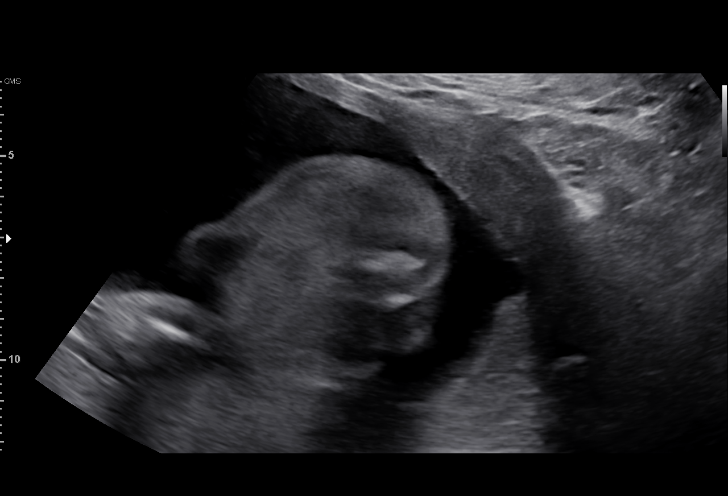
[im 116/121]
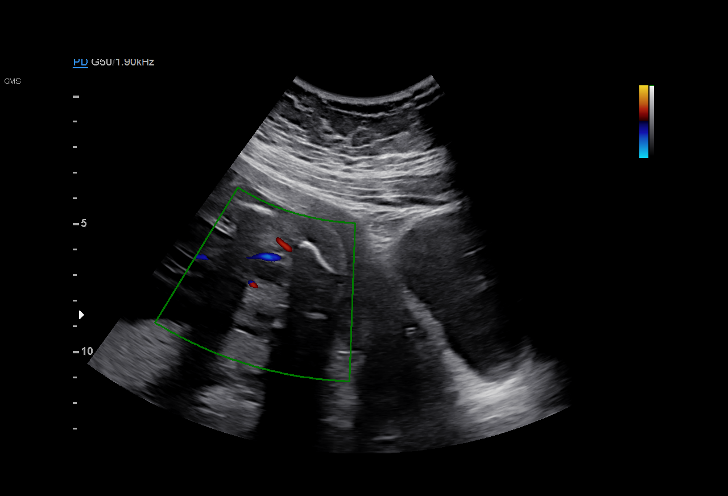

[13 of 28 positions shown; findings below may reference images not displayed]

----------------------------------------------------------------------

 ----------------------------------------------------------------------
Indications

  Pregnancy resulting from assisted              [R7]
  reproductive technology (IVF)
  Obesity complicating pregnancy, second         [R7]
  trimester
  Medical complication of pregnancy (PCOS -      [R7]
  metformin, migraines-labetalol, Imitrex)
  Encounter for antenatal screening for          [R7]
  malformations
  Hypothyroid (Synthroid)                        [R7] [R7]
  24 weeks gestation of pregnancy
 ----------------------------------------------------------------------
Fetal Evaluation

 Num Of Fetuses:         1
 Fetal Heart Rate(bpm):  154
 Cardiac Activity:       Observed
 Presentation:           Cephalic
 Placenta:               Posterior
 P. Cord Insertion:      Velamentous vs. marginal

 Amniotic Fluid
 AFI FV:      Mild Polyhydramnios

                             Largest Pocket(cm)

Biometry

 BPD:      57.6  mm     G. Age:  23w 4d         21  %    CI:        72.48   %    70 - 86
                                                         FL/HC:      18.4   %    18.7 -
 HC:      215.2  mm     G. Age:  23w 4d         11  %    HC/AC:      1.11        1.05 -
 AC:      194.7  mm     G. Age:  24w 1d         37  %    FL/BPD:     68.6   %    71 - 87
 FL:       39.5  mm     G. Age:  22w 5d          5  %    FL/AC:      20.3   %    20 - 24
 HUM:        38  mm     G. Age:  23w 3d         21  %
 CER:      27.3  mm     G. Age:  24w 5d         59  %

 CM:        4.6  mm

 Est. FW:     605  gm      1 lb 5 oz     14  %
OB History

 Gravidity:    1         Term:   0        Prem:   0        SAB:   0
 TOP:          0       Ectopic:  0        Living: 0
Gestational Age

 LMP:           24w 5d        Date:  [DATE]                 EDD:   [DATE]
 U/S Today:     23w 4d                                        EDD:   [DATE]
 Best:          24w 2d     Det. By:  Early Ultrasound         EDD:   [DATE]
                                     ([DATE])
Anatomy

 Cranium:               Appears normal         LVOT:                   Appears normal
 Cavum:                 Appears normal         Aortic Arch:            Appears normal
 Ventricles:            Appears normal         Ductal Arch:            Appears normal
 Choroid Plexus:        Appears normal         Diaphragm:              Appears normal
 Cerebellum:            Appears normal         Stomach:                Appears normal, left
                                                                       sided
 Posterior Fossa:       Appears normal         Abdomen:                Appears normal
 Nuchal Fold:           Not applicable (>20    Abdominal Wall:         Appears nml (cord
                        wks GA)                                        insert, abd wall)
 Face:                  Appears normal         Cord Vessels:           Appears normal (3
                        (orbits and profile)                           vessel cord)
 Lips:                  Appears normal         Kidneys:                Appear normal
 Palate:                Appears normal         Bladder:                Appears normal
 Thoracic:              Appears normal         Spine:                  Appears normal
 Heart:                 Appears normal         Upper Extremities:      Appears normal
                        (4CH, axis, and
                        situs)
 RVOT:                  Appears normal         Lower Extremities:      Appears normal

 Other:  Heels/feet and hands/Left 5th digit visualized. Nasal bone visualized.
         Parents do not wish to know sex of fetus.
Cervix Uterus Adnexa

 Cervix
 Length:            3.9  cm.
 Normal appearance by transabdominal scan.

 Uterus
 No abnormality visualized.

 Left Ovary
 Within normal limits.

 Right Ovary
 Within normal limits.
 Cul De Sac
 No free fluid seen.

 Adnexa
 No abnormality visualized.
Comments

 This patient was seen for a detailed fetal anatomy scan due
 to maternal obesity and an IVF pregnancy.  The views of the
 fetal anatomy could not be fully visualized in your office.  The
 patient reports a history of PCOS.  She denies any problems
 in her current pregnancy.
 The patient had a quad screen in her current pregnancy
 which indicated a Down syndrome risk of 1 and 916.  An
 MSAFP was 0.82 MoM.
 She was informed that the fetal growth appears appropriate
 for her gestational age.  Polyhydramnios with a maximal
 vertical pocket of greater than 8 cm was noted today.
 There were no obvious fetal anomalies noted on today's
 ultrasound exam.
 The patient was informed that anomalies may be missed due
 to technical limitations. If the fetus is in a suboptimal position
 or maternal habitus is increased, visualization of the fetus in
 the maternal uterus may be impaired.
 A possible velamentous cord insertion was noted today. Due
 to this indication, the patient should continue to be followed
 with growth ultrasounds in your office.
 As IVF pregnancies may be at increased risk for congenital
 heart defects, a fetal echocardiogram is recommended. The
 patient was reassured that there were no obvious cardiac
 defects noted on today's ultrasound.
 No further exams were scheduled in our office.

## 2019-12-11 DIAGNOSIS — Z0000 Encounter for general adult medical examination without abnormal findings: Secondary | ICD-10-CM | POA: Diagnosis not present

## 2019-12-25 DIAGNOSIS — O0992 Supervision of high risk pregnancy, unspecified, second trimester: Secondary | ICD-10-CM | POA: Diagnosis not present

## 2020-01-14 ENCOUNTER — Ambulatory Visit (INDEPENDENT_AMBULATORY_CARE_PROVIDER_SITE_OTHER): Payer: Self-pay | Admitting: Pediatrics

## 2020-01-14 ENCOUNTER — Other Ambulatory Visit: Payer: Self-pay

## 2020-01-14 ENCOUNTER — Encounter: Payer: Self-pay | Admitting: Pediatrics

## 2020-01-14 DIAGNOSIS — Z7681 Expectant parent(s) prebirth pediatrician visit: Secondary | ICD-10-CM

## 2020-01-14 NOTE — Progress Notes (Signed)
Prenatal counseling for impending newborn done-- Z76.81  

## 2020-01-15 DIAGNOSIS — Z23 Encounter for immunization: Secondary | ICD-10-CM | POA: Diagnosis not present

## 2020-01-15 DIAGNOSIS — O99213 Obesity complicating pregnancy, third trimester: Secondary | ICD-10-CM | POA: Diagnosis not present

## 2020-01-28 DIAGNOSIS — E039 Hypothyroidism, unspecified: Secondary | ICD-10-CM | POA: Diagnosis not present

## 2020-02-04 DIAGNOSIS — Z20828 Contact with and (suspected) exposure to other viral communicable diseases: Secondary | ICD-10-CM | POA: Diagnosis not present

## 2020-02-15 DIAGNOSIS — O99213 Obesity complicating pregnancy, third trimester: Secondary | ICD-10-CM | POA: Diagnosis not present

## 2020-02-22 DIAGNOSIS — O0993 Supervision of high risk pregnancy, unspecified, third trimester: Secondary | ICD-10-CM | POA: Diagnosis not present

## 2020-02-27 DIAGNOSIS — O43123 Velamentous insertion of umbilical cord, third trimester: Secondary | ICD-10-CM | POA: Diagnosis not present

## 2020-02-27 DIAGNOSIS — O321XX0 Maternal care for breech presentation, not applicable or unspecified: Secondary | ICD-10-CM | POA: Diagnosis not present

## 2020-02-28 NOTE — Patient Instructions (Signed)
Katherine Crawford  02/28/2020   Your procedure is scheduled on:  03/12/2020  Arrive at 0530 at Entrance C on [address] at [facility]  and [address]. You are invited to use the FREE valet parking or use the Visitor's parking deck.  Pick up the phone at the desk and dial [phone].  Call this number if you have problems the morning of surgery: [phone]  Remember:   Do not eat food:(After Midnight) Desps de medianoche.  Do not drink clear liquids: (After Midnight) Desps de medianoche.  Take these medicines the morning of surgery with A SIP OF WATER:  Do not take metformin the night before your surgery.  Take your labetalol and levothyroxine as prescribed.   Do not wear jewelry, make-up or nail polish.  Do not wear lotions, powders, or perfumes. Do not wear deodorant.  Do not shave 48 hours prior to surgery.  Do not bring valuables to the hospital.  [facility] is not   responsible for any belongings or valuables brought to the hospital.  Contacts, dentures or bridgework may not be worn into surgery.  Leave suitcase in the car. After surgery it may be brought to your room.  For patients admitted to the hospital, checkout time is 11:00 AM the day of              discharge.      Please read over the following fact sheets that you were given:     Preparing for Surgery

## 2020-03-03 ENCOUNTER — Encounter (HOSPITAL_COMMUNITY): Payer: Self-pay

## 2020-03-03 LAB — OB RESULTS CONSOLE GBS: GBS: NEGATIVE

## 2020-03-05 ENCOUNTER — Encounter (HOSPITAL_COMMUNITY): Payer: Self-pay

## 2020-03-10 ENCOUNTER — Other Ambulatory Visit: Payer: Self-pay

## 2020-03-10 ENCOUNTER — Other Ambulatory Visit (HOSPITAL_COMMUNITY)
Admission: RE | Admit: 2020-03-10 | Discharge: 2020-03-10 | Disposition: A | Discharge status: Home / Self Care | Payer: BLUE CROSS/BLUE SHIELD | Source: Ambulatory Visit | Attending: Obstetrics & Gynecology | Admitting: Obstetrics & Gynecology

## 2020-03-10 DIAGNOSIS — Z01812 Encounter for preprocedural laboratory examination: Secondary | ICD-10-CM | POA: Diagnosis not present

## 2020-03-10 DIAGNOSIS — Z20822 Contact with and (suspected) exposure to covid-19: Secondary | ICD-10-CM | POA: Diagnosis not present

## 2020-03-10 LAB — CBC
HCT: 36.5 % (ref 36.0–46.0)
Hemoglobin: 12.4 g/dL (ref 12.0–15.0)
MCH: 30.4 pg (ref 26.0–34.0)
MCHC: 34 g/dL (ref 30.0–36.0)
MCV: 89.5 fL (ref 80.0–100.0)
Platelets: 239 10*3/uL (ref 150–400)
RBC: 4.08 MIL/uL (ref 3.87–5.11)
RDW: 13.5 % (ref 11.5–15.5)
WBC: 10.4 10*3/uL (ref 4.0–10.5)
nRBC: 0 % (ref 0.0–0.2)

## 2020-03-10 LAB — TYPE AND SCREEN
ABO/RH(D): O POS
Antibody Screen: NEGATIVE

## 2020-03-10 LAB — RPR: RPR Ser Ql: NONREACTIVE

## 2020-03-10 LAB — ABO/RH: ABO/RH(D): O POS

## 2020-03-10 LAB — SARS CORONAVIRUS 2 (TAT 6-24 HRS): SARS Coronavirus 2: NEGATIVE

## 2020-03-10 NOTE — H&P (Signed)
HPI: 34 y/o G1P0 @ [redacted]w[redacted]d estimated gestational age (as dated by LMP c/w 20 week ultrasound) presents for scheduled external cephalic version and possible C-section if failed procedure.   no Leaking of Fluid,   no Vaginal Bleeding,   no Uterine Contractions,  + Fetal Movement.  Prenatal care has been provided by Dr. Charlotta Newton  ROS: no HA, no epigastric pain, no visual changes.    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%)    Prenatal Transfer Tool  Maternal Diabetes: No Genetic Screening: Normal Maternal Ultrasounds/Referrals: Normal Fetal Ultrasounds or other Referrals:  None Maternal Substance Abuse:  No Significant Maternal Medications:  Meds include: Syntroid, Glucophage, labetalol Significant Maternal Lab Results: Group B Strep negative   PNL:  GBS negative, Rub Immune, Hep B neg, RPR NR, HIV neg, GC/C neg, glucola:81 Blood type: O positive, antibody neg  Immunizations: Tdap: 01/15/20 Flu: outside facility  OBHx: primip PMHx:  PCOS, migraines, obesity Meds:  PNV, synthroid [place] daily, Glucophage 750mg  daily, Labetalol 100mg  bid Allergy:  No Known Allergies SurgHx: none SocHx:   no Tobacco, no  EtOH, no Illicit Drugs  O: LMP 06/10/2019  Gen. AAOx3, NAD CV.  RRR  No murmur.  Resp. CTAB, no wheeze or crackles. Abd. Gravid,  no tenderness,  no rigidity,  no guarding Extr.  no edema B/L , no calf tenderness, neg Homan's B/L FHT: 155 by doppler  Labs: see orders  A/P:  34 y.o. G1P0 @ [redacted]w[redacted]d EGA who presents for scheduled external cephalic version (ECV) and possible c-section if failed procedure -NPO -LR @ 125cc/hr -Plan for epidural followed by ECV- risk/benefits and alternatives reviewed with both her and her husband.  Questions and concerns  were addressed.  If procedure is unsuccessful, plan for primary C-section due to breech presentation  , DO [phone] (cell) [phone] (office)

## 2020-03-12 ENCOUNTER — Encounter (HOSPITAL_COMMUNITY)
Admission: RE | Disposition: A | Discharge status: Home / Self Care | Payer: Self-pay | Source: Home / Self Care | Attending: Obstetrics & Gynecology

## 2020-03-12 ENCOUNTER — Encounter (HOSPITAL_COMMUNITY): Payer: Self-pay | Admitting: Obstetrics & Gynecology

## 2020-03-12 ENCOUNTER — Inpatient Hospital Stay (HOSPITAL_COMMUNITY): Payer: BLUE CROSS/BLUE SHIELD | Admitting: Anesthesiology

## 2020-03-12 ENCOUNTER — Other Ambulatory Visit: Payer: Self-pay

## 2020-03-12 ENCOUNTER — Inpatient Hospital Stay (HOSPITAL_COMMUNITY)
Admission: RE | Admit: 2020-03-12 | Discharge: 2020-03-14 | DRG: 788 | Disposition: A | Discharge status: Home / Self Care | Payer: BLUE CROSS/BLUE SHIELD | Attending: Obstetrics & Gynecology | Admitting: Obstetrics & Gynecology

## 2020-03-12 DIAGNOSIS — O99284 Endocrine, nutritional and metabolic diseases complicating childbirth: Secondary | ICD-10-CM | POA: Diagnosis not present

## 2020-03-12 DIAGNOSIS — E039 Hypothyroidism, unspecified: Secondary | ICD-10-CM | POA: Diagnosis not present

## 2020-03-12 DIAGNOSIS — O43123 Velamentous insertion of umbilical cord, third trimester: Secondary | ICD-10-CM | POA: Diagnosis not present

## 2020-03-12 DIAGNOSIS — R0603 Acute respiratory distress: Secondary | ICD-10-CM | POA: Diagnosis not present

## 2020-03-12 DIAGNOSIS — O328XX0 Maternal care for other malpresentation of fetus, not applicable or unspecified: Principal | ICD-10-CM | POA: Diagnosis present

## 2020-03-12 DIAGNOSIS — O99214 Obesity complicating childbirth: Secondary | ICD-10-CM | POA: Diagnosis present

## 2020-03-12 DIAGNOSIS — Z3493 Encounter for supervision of normal pregnancy, unspecified, third trimester: Secondary | ICD-10-CM

## 2020-03-12 DIAGNOSIS — Z3A39 39 weeks gestation of pregnancy: Secondary | ICD-10-CM

## 2020-03-12 DIAGNOSIS — E282 Polycystic ovarian syndrome: Secondary | ICD-10-CM | POA: Diagnosis present

## 2020-03-12 DIAGNOSIS — Z23 Encounter for immunization: Secondary | ICD-10-CM | POA: Diagnosis not present

## 2020-03-12 DIAGNOSIS — O321XX0 Maternal care for breech presentation, not applicable or unspecified: Secondary | ICD-10-CM | POA: Diagnosis not present

## 2020-03-12 DIAGNOSIS — Z051 Observation and evaluation of newborn for suspected infectious condition ruled out: Secondary | ICD-10-CM | POA: Diagnosis not present

## 2020-03-12 DIAGNOSIS — Z6841 Body Mass Index (BMI) 40.0 and over, adult: Secondary | ICD-10-CM | POA: Diagnosis not present

## 2020-03-12 SURGERY — Surgical Case
Anesthesia: Epidural

## 2020-03-12 MED ORDER — DIPHENHYDRAMINE HCL 25 MG PO CAPS: 25.0000 mg | ORAL_CAPSULE | ORAL | Status: DC | PRN

## 2020-03-12 MED ORDER — KETOROLAC TROMETHAMINE 30 MG/ML IJ SOLN: 30.0000 mg | Freq: Four times a day (QID) | INTRAMUSCULAR | Status: AC | PRN

## 2020-03-12 MED ORDER — DIPHENHYDRAMINE HCL 25 MG PO CAPS: 25.0000 mg | ORAL_CAPSULE | Freq: Four times a day (QID) | ORAL | Status: DC | PRN

## 2020-03-12 MED ORDER — SCOPOLAMINE 1 MG/3DAYS TD PT72: 1.0000 | MEDICATED_PATCH | Freq: Once | TRANSDERMAL | Status: DC

## 2020-03-12 MED ORDER — ACETAMINOPHEN 325 MG PO TABS
650.0000 mg | ORAL_TABLET | Freq: Four times a day (QID) | ORAL | Status: DC | PRN
  Administered 2020-03-13 – 2020-03-14 (×5): 650 mg via ORAL
  Filled 2020-03-12 (×5): qty 2

## 2020-03-12 MED ORDER — KETOROLAC TROMETHAMINE 30 MG/ML IJ SOLN
30.0000 mg | Freq: Four times a day (QID) | INTRAMUSCULAR | Status: AC
  Administered 2020-03-12 – 2020-03-13 (×4): 30 mg via INTRAVENOUS
  Filled 2020-03-12 (×5): qty 1

## 2020-03-12 MED ORDER — PHENYLEPHRINE 40 MCG/ML (10ML) SYRINGE FOR IV PUSH (FOR BLOOD PRESSURE SUPPORT)
PREFILLED_SYRINGE | INTRAVENOUS | Status: DC | PRN
  Administered 2020-03-12 (×3): 80 ug via INTRAVENOUS

## 2020-03-12 MED ORDER — NALBUPHINE HCL 10 MG/ML IJ SOLN
5.0000 mg | INTRAMUSCULAR | Status: DC | PRN
  Administered 2020-03-12: 5 mg via INTRAVENOUS
  Filled 2020-03-12 (×2): qty 1

## 2020-03-12 MED ORDER — ACETAMINOPHEN 500 MG PO TABS
1000.0000 mg | ORAL_TABLET | Freq: Four times a day (QID) | ORAL | Status: AC
  Administered 2020-03-12 – 2020-03-13 (×3): 1000 mg via ORAL
  Filled 2020-03-12 (×3): qty 2

## 2020-03-12 MED ORDER — OXYCODONE HCL 5 MG PO TABS
5.0000 mg | ORAL_TABLET | Freq: Four times a day (QID) | ORAL | Status: DC | PRN
  Administered 2020-03-13 (×2): 5 mg via ORAL
  Administered 2020-03-14 (×2): 10 mg via ORAL
  Filled 2020-03-12 (×2): qty 2
  Filled 2020-03-12 (×2): qty 1
  Filled 2020-03-12: qty 2

## 2020-03-12 MED ORDER — LIDOCAINE 2% (20 MG/ML) 5 ML SYRINGE
INTRAMUSCULAR | Status: AC
  Filled 2020-03-12: qty 5

## 2020-03-12 MED ORDER — ONDANSETRON HCL 4 MG/2ML IJ SOLN
4.0000 mg | Freq: Once | INTRAMUSCULAR | Status: AC
  Administered 2020-03-12: 4 mg via INTRAVENOUS

## 2020-03-12 MED ORDER — ONDANSETRON HCL 4 MG/2ML IJ SOLN: 4.0000 mg | Freq: Three times a day (TID) | INTRAMUSCULAR | Status: DC | PRN

## 2020-03-12 MED ORDER — NALBUPHINE HCL 10 MG/ML IJ SOLN
5.0000 mg | Freq: Once | INTRAMUSCULAR | Status: AC | PRN
  Administered 2020-03-12: 5 mg via INTRAVENOUS

## 2020-03-12 MED ORDER — MORPHINE SULFATE (PF) 0.5 MG/ML IJ SOLN
INTRAMUSCULAR | Status: DC | PRN
  Administered 2020-03-12: 3 mg via EPIDURAL

## 2020-03-12 MED ORDER — MEASLES, MUMPS & RUBELLA VAC IJ SOLR
0.5000 mL | Freq: Once | INTRAMUSCULAR | Status: AC
  Administered 2020-03-14: 0.5 mL via SUBCUTANEOUS
  Filled 2020-03-12: qty 0.5

## 2020-03-12 MED ORDER — LACTATED RINGERS IV SOLN: INTRAVENOUS | Status: DC

## 2020-03-12 MED ORDER — PHENYLEPHRINE HCL-NACL 20-0.9 MG/250ML-% IV SOLN
INTRAVENOUS | Status: AC
  Filled 2020-03-12: qty 250

## 2020-03-12 MED ORDER — MORPHINE SULFATE (PF) 0.5 MG/ML IJ SOLN
INTRAMUSCULAR | Status: AC
  Filled 2020-03-12: qty 10

## 2020-03-12 MED ORDER — ENOXAPARIN SODIUM 60 MG/0.6ML ~~LOC~~ SOLN
60.0000 mg | SUBCUTANEOUS | Status: DC
  Administered 2020-03-13 – 2020-03-14 (×2): 60 mg via SUBCUTANEOUS
  Filled 2020-03-12 (×2): qty 0.6

## 2020-03-12 MED ORDER — NALBUPHINE HCL 10 MG/ML IJ SOLN: 5.0000 mg | INTRAMUSCULAR | Status: DC | PRN

## 2020-03-12 MED ORDER — OXYTOCIN 40 UNITS IN NORMAL SALINE INFUSION - SIMPLE MED: 2.5000 [IU]/h | INTRAVENOUS | Status: AC

## 2020-03-12 MED ORDER — OXYTOCIN 40 UNITS IN NORMAL SALINE INFUSION - SIMPLE MED
INTRAVENOUS | Status: DC | PRN
  Administered 2020-03-12 (×2): 350 mL via INTRAVENOUS

## 2020-03-12 MED ORDER — DIPHENHYDRAMINE HCL 50 MG/ML IJ SOLN: 12.5000 mg | INTRAMUSCULAR | Status: DC | PRN

## 2020-03-12 MED ORDER — ALBUTEROL SULFATE (2.5 MG/3ML) 0.083% IN NEBU: 3.0000 mL | INHALATION_SOLUTION | RESPIRATORY_TRACT | Status: DC | PRN

## 2020-03-12 MED ORDER — PRENATAL MULTIVITAMIN CH
1.0000 | ORAL_TABLET | Freq: Every day | ORAL | Status: DC
  Administered 2020-03-13 – 2020-03-14 (×2): 1 via ORAL
  Filled 2020-03-12 (×2): qty 1

## 2020-03-12 MED ORDER — MENTHOL 3 MG MT LOZG: 1.0000 | LOZENGE | OROMUCOSAL | Status: DC | PRN

## 2020-03-12 MED ORDER — FENTANYL CITRATE (PF) 100 MCG/2ML IJ SOLN: 25.0000 ug | INTRAMUSCULAR | Status: DC | PRN

## 2020-03-12 MED ORDER — PHENYLEPHRINE 40 MCG/ML (10ML) SYRINGE FOR IV PUSH (FOR BLOOD PRESSURE SUPPORT)
PREFILLED_SYRINGE | INTRAVENOUS | Status: AC
  Filled 2020-03-12: qty 10

## 2020-03-12 MED ORDER — LEVOTHYROXINE SODIUM 50 MCG PO TABS
50.0000 ug | ORAL_TABLET | Freq: Every day | ORAL | Status: DC
  Administered 2020-03-13 – 2020-03-14 (×2): 50 ug via ORAL
  Filled 2020-03-12 (×2): qty 1

## 2020-03-12 MED ORDER — FENTANYL CITRATE (PF) 100 MCG/2ML IJ SOLN
INTRAMUSCULAR | Status: DC | PRN
  Administered 2020-03-12 (×2): 50 ug via EPIDURAL

## 2020-03-12 MED ORDER — FENTANYL CITRATE (PF) 100 MCG/2ML IJ SOLN
INTRAMUSCULAR | Status: DC | PRN
  Administered 2020-03-12: 100 ug via INTRAVENOUS

## 2020-03-12 MED ORDER — LIDOCAINE-EPINEPHRINE (PF) 2 %-1:200000 IJ SOLN
INTRAMUSCULAR | Status: DC | PRN
  Administered 2020-03-12: 2 mL via INTRADERMAL

## 2020-03-12 MED ORDER — NALOXONE HCL 4 MG/10ML IJ SOLN
1.0000 ug/kg/h | INTRAVENOUS | Status: DC | PRN
  Filled 2020-03-12: qty 5

## 2020-03-12 MED ORDER — ONDANSETRON HCL 4 MG/2ML IJ SOLN
INTRAMUSCULAR | Status: AC
  Filled 2020-03-12: qty 2

## 2020-03-12 MED ORDER — CEFAZOLIN SODIUM-DEXTROSE 2-4 GM/100ML-% IV SOLN
2.0000 g | INTRAVENOUS | Status: AC
  Administered 2020-03-12: 2 g via INTRAVENOUS

## 2020-03-12 MED ORDER — NALBUPHINE HCL 10 MG/ML IJ SOLN: 5.0000 mg | Freq: Once | INTRAMUSCULAR | Status: AC | PRN

## 2020-03-12 MED ORDER — SIMETHICONE 80 MG PO CHEW: 80.0000 mg | CHEWABLE_TABLET | ORAL | Status: DC | PRN

## 2020-03-12 MED ORDER — SIMETHICONE 80 MG PO CHEW
80.0000 mg | CHEWABLE_TABLET | Freq: Three times a day (TID) | ORAL | Status: DC
  Administered 2020-03-12 – 2020-03-14 (×6): 80 mg via ORAL
  Filled 2020-03-12 (×6): qty 1

## 2020-03-12 MED ORDER — PHENYLEPHRINE 40 MCG/ML (10ML) SYRINGE FOR IV PUSH (FOR BLOOD PRESSURE SUPPORT)
PREFILLED_SYRINGE | INTRAVENOUS | Status: AC
  Filled 2020-03-12: qty 20

## 2020-03-12 MED ORDER — STERILE WATER FOR IRRIGATION IR SOLN
Status: DC | PRN
  Administered 2020-03-12: 1000 mL

## 2020-03-12 MED ORDER — ACETAMINOPHEN 10 MG/ML IV SOLN
INTRAVENOUS | Status: AC
  Filled 2020-03-12: qty 100

## 2020-03-12 MED ORDER — SODIUM CHLORIDE 0.9 % IR SOLN
Status: DC | PRN
  Administered 2020-03-12: 1000 mL

## 2020-03-12 MED ORDER — IBUPROFEN 600 MG PO TABS
600.0000 mg | ORAL_TABLET | Freq: Four times a day (QID) | ORAL | Status: DC
  Administered 2020-03-13 – 2020-03-14 (×4): 600 mg via ORAL
  Filled 2020-03-12 (×4): qty 1

## 2020-03-12 MED ORDER — METFORMIN HCL ER 750 MG PO TB24
750.0000 mg | ORAL_TABLET | Freq: Every day | ORAL | Status: DC
  Administered 2020-03-12 – 2020-03-13 (×2): 750 mg via ORAL
  Filled 2020-03-12 (×4): qty 1

## 2020-03-12 MED ORDER — DIBUCAINE (PERIANAL) 1 % EX OINT: 1.0000 "application " | TOPICAL_OINTMENT | CUTANEOUS | Status: DC | PRN

## 2020-03-12 MED ORDER — OXYTOCIN 40 UNITS IN NORMAL SALINE INFUSION - SIMPLE MED
INTRAVENOUS | Status: AC
  Filled 2020-03-12: qty 1000

## 2020-03-12 MED ORDER — COCONUT OIL OIL: 1.0000 "application " | TOPICAL_OIL | Status: DC | PRN

## 2020-03-12 MED ORDER — WITCH HAZEL-GLYCERIN EX PADS: 1.0000 "application " | MEDICATED_PAD | CUTANEOUS | Status: DC | PRN

## 2020-03-12 MED ORDER — SENNOSIDES-DOCUSATE SODIUM 8.6-50 MG PO TABS
2.0000 | ORAL_TABLET | ORAL | Status: DC
  Administered 2020-03-13 (×2): 2 via ORAL
  Filled 2020-03-12 (×2): qty 2

## 2020-03-12 MED ORDER — FENTANYL CITRATE (PF) 100 MCG/2ML IJ SOLN
INTRAMUSCULAR | Status: AC
  Filled 2020-03-12: qty 2

## 2020-03-12 MED ORDER — ACETAMINOPHEN 10 MG/ML IV SOLN
1000.0000 mg | Freq: Once | INTRAVENOUS | Status: DC | PRN
  Administered 2020-03-12: 1000 mg via INTRAVENOUS

## 2020-03-12 MED ORDER — SODIUM CHLORIDE 0.9% FLUSH: 3.0000 mL | INTRAVENOUS | Status: DC | PRN

## 2020-03-12 MED ORDER — ZOLPIDEM TARTRATE 5 MG PO TABS: 5.0000 mg | ORAL_TABLET | Freq: Every evening | ORAL | Status: DC | PRN

## 2020-03-12 MED ORDER — PHENYLEPHRINE HCL-NACL 20-0.9 MG/250ML-% IV SOLN
INTRAVENOUS | Status: DC | PRN
  Administered 2020-03-12: 60 ug/min via INTRAVENOUS

## 2020-03-12 MED ORDER — PRENATAL MULTIVITAMIN CH: 1.0000 | ORAL_TABLET | Freq: Every day | ORAL | Status: DC

## 2020-03-12 MED ORDER — TERBUTALINE SULFATE 1 MG/ML IJ SOLN
INTRAMUSCULAR | Status: AC
  Filled 2020-03-12: qty 1

## 2020-03-12 MED ORDER — KETOROLAC TROMETHAMINE 30 MG/ML IJ SOLN: 30.0000 mg | Freq: Once | INTRAMUSCULAR | Status: DC | PRN

## 2020-03-12 MED ORDER — SIMETHICONE 80 MG PO CHEW
80.0000 mg | CHEWABLE_TABLET | ORAL | Status: DC
  Administered 2020-03-13 (×2): 80 mg via ORAL
  Filled 2020-03-12 (×2): qty 1

## 2020-03-12 MED ORDER — NALOXONE HCL 0.4 MG/ML IJ SOLN: 0.4000 mg | INTRAMUSCULAR | Status: DC | PRN

## 2020-03-12 SURGICAL SUPPLY — 37 items
BARRIER ADHS 3X4 INTERCEED (GAUZE/BANDAGES/DRESSINGS) ×1 IMPLANT
BENZOIN TINCTURE PRP APPL 2/3 (GAUZE/BANDAGES/DRESSINGS) ×2 IMPLANT
CHLORAPREP W/TINT 26ML (MISCELLANEOUS) ×2 IMPLANT
CLAMP CORD UMBIL (MISCELLANEOUS) IMPLANT
CLOTH BEACON ORANGE TIMEOUT ST (SAFETY) ×2 IMPLANT
DERMABOND ADVANCED (GAUZE/BANDAGES/DRESSINGS)
DERMABOND ADVANCED .7 DNX12 (GAUZE/BANDAGES/DRESSINGS) IMPLANT
DRSG OPSITE POSTOP 4X10 (GAUZE/BANDAGES/DRESSINGS) ×2 IMPLANT
ELECT REM PT RETURN 9FT ADLT (ELECTROSURGICAL) ×2
ELECTRODE REM PT RTRN 9FT ADLT (ELECTROSURGICAL) ×1 IMPLANT
EXTRACTOR VACUUM KIWI (MISCELLANEOUS) IMPLANT
GLOVE BIOGEL PI IND STRL 7.0 (GLOVE) ×3 IMPLANT
GLOVE BIOGEL PI INDICATOR 7.0 (GLOVE) ×3
GLOVE ECLIPSE 6.5 STRL STRAW (GLOVE) ×2 IMPLANT
GOWN STRL REUS W/TWL LRG LVL3 (GOWN DISPOSABLE) ×6 IMPLANT
KIT ABG SYR 3ML LUER SLIP (SYRINGE) IMPLANT
NDL HYPO 25X5/8 SAFETYGLIDE (NEEDLE) IMPLANT
NEEDLE HYPO 25X5/8 SAFETYGLIDE (NEEDLE) IMPLANT
NS IRRIG 1000ML POUR BTL (IV SOLUTION) ×2 IMPLANT
PACK C SECTION WH (CUSTOM PROCEDURE TRAY) ×2 IMPLANT
PAD ABD 7.5X8 STRL (GAUZE/BANDAGES/DRESSINGS) ×2 IMPLANT
PAD OB MATERNITY 4.3X12.25 (PERSONAL CARE ITEMS) ×2 IMPLANT
PENCIL SMOKE EVAC W/HOLSTER (ELECTROSURGICAL) ×2 IMPLANT
RTRCTR C-SECT PINK 25CM LRG (MISCELLANEOUS) ×2 IMPLANT
STRIP CLOSURE SKIN 1/2X4 (GAUZE/BANDAGES/DRESSINGS) ×2 IMPLANT
SUT PLAIN 0 NONE (SUTURE) IMPLANT
SUT PLAIN 2 0 XLH (SUTURE) ×1 IMPLANT
SUT VIC AB 0 CT1 27 (SUTURE) ×2
SUT VIC AB 0 CT1 27XBRD ANBCTR (SUTURE) ×2 IMPLANT
SUT VIC AB 0 CTX 36 (SUTURE) ×3
SUT VIC AB 0 CTX36XBRD ANBCTRL (SUTURE) ×3 IMPLANT
SUT VIC AB 2-0 CT1 27 (SUTURE) ×1
SUT VIC AB 2-0 CT1 TAPERPNT 27 (SUTURE) ×1 IMPLANT
SUT VIC AB 4-0 KS 27 (SUTURE) ×2 IMPLANT
TOWEL OR 17X24 6PK STRL BLUE (TOWEL DISPOSABLE) ×2 IMPLANT
TRAY FOLEY W/BAG SLVR 14FR LF (SET/KITS/TRAYS/PACK) IMPLANT
WATER STERILE IRR 1000ML POUR (IV SOLUTION) ×2 IMPLANT

## 2020-03-12 NOTE — Anesthesia Postprocedure Evaluation (Signed)
Anesthesia Post Note  Patient: Katherine Crawford  Procedure(s) Performed: CESAREAN SECTION (N/A )     Patient location during evaluation: PACU Anesthesia Type: Epidural Level of consciousness: oriented and awake and alert Pain management: pain level controlled Vital Signs Assessment: post-procedure vital signs reviewed and stable Respiratory status: spontaneous breathing, respiratory function stable and patient connected to nasal cannula oxygen Cardiovascular status: blood pressure returned to baseline and stable Postop Assessment: no headache, no backache, no apparent nausea or vomiting and epidural receding Anesthetic complications: no    Last Vitals:  Vitals:   03/12/20 1022 03/12/20 1037  BP: 113/80 129/74  Pulse: 63 67  Resp: 12 16  Temp: (!) 36.3 C 36.4 C  SpO2: 96% 99%    Last Pain:  Vitals:   03/12/20 1037  TempSrc: Oral  PainSc:    Pain Goal:                Epidural/Spinal Function Cutaneous sensation: Tingles (03/12/20 1022), Patient able to flex knees: Yes (03/12/20 1022), Patient able to lift hips off bed: No (03/12/20 1022), Back pain beyond tenderness at insertion site: No (03/12/20 1022), Progressively worsening motor and/or sensory loss: No (03/12/20 1022), Bowel and/or bladder incontinence post epidural: No (03/12/20 1022)  Ojani Berenson L Crispin Vogel

## 2020-03-12 NOTE — Anesthesia Procedure Notes (Signed)
Epidural Patient location during procedure: OB Start time: 03/12/2020 7:45 AM End time: 03/12/2020 8:00 AM  Staffing Anesthesiologist: Elmer Picker, MD Performed: anesthesiologist   Preanesthetic Checklist Completed: patient identified, IV checked, risks and benefits discussed, monitors and equipment checked, pre-op evaluation and timeout performed  Epidural Patient position: sitting Prep: DuraPrep and site prepped and draped Patient monitoring: continuous pulse ox, blood pressure, heart rate and cardiac monitor Approach: midline Location: L3-L4 Injection technique: LOR air  Needle:  Needle type: Tuohy  Needle gauge: 17 G Needle length: 9 cm Needle insertion depth: 6 cm Catheter type: closed end flexible Catheter size: 19 Gauge Catheter at skin depth: 12 cm Test dose: negative  Assessment Sensory level: T8 Events: blood not aspirated, injection not painful, no injection resistance, no paresthesia and negative IV test  Additional Notes Patient identified. Risks/Benefits/Options discussed with patient including but not limited to bleeding, infection, nerve damage, paralysis, failed block, incomplete pain control, headache, blood pressure changes, nausea, vomiting, reactions to medication both or allergic, itching and postpartum back pain. Confirmed with bedside nurse the patient's most recent platelet count. Confirmed with patient that they are not currently taking any anticoagulation, have any bleeding history or any family history of bleeding disorders. Patient expressed understanding and wished to proceed. All questions were answered. Sterile technique was used throughout the entire procedure. Please see nursing notes for vital signs. Test dose was given through epidural catheter and negative prior to continuing to dose epidural or start infusion. Warning signs of high block given to the patient including shortness of breath, tingling/numbness in hands, complete motor block,  or any concerning symptoms with instructions to call for help. Patient was given instructions on fall risk and not to get out of bed. All questions and concerns addressed with instructions to call with any issues or inadequate analgesia.  Reason for block:procedure for pain

## 2020-03-12 NOTE — Lactation Note (Signed)
This note was copied from a baby's chart. 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  LC entered room to find MB RN finishing admission process. Mom in bed holding baby. Mom states "I think I fed her about an hour ago but I don't know if she was really feeding." Mom reports tough time with C/S and glad to be delivered. Mom reports increase in breast size and darkening of areola during her pregnancy.  Infant cuing in mom's arms so LC offered to assist with latch and mom agrees. Reinforced hand expression as previously demonstrated by other staff members. Small drop expressed after few compressions. Encouraged to perform prior to latching. Mom with medium breasts and small but erect at rest nipple. Wide gap noted between her breasts.  Infant positioned on right breast in football hold position after blankets removed. Latch technique demonstrated and infant latched but minimal suckling elicited. Infant awake and alert but uninterested at this time. Oral anatomy WNL. Discussed signs of proper deep latch such as lips flanged, nose/chin touching breast during feeding.   Described normal newborn feeding behaviors during the first 24 hours of life. Encouraged STS as much as possible and if baby sleepy/difficulty waking for feedings. Also reviewed checking diaper and/or hand expressing EBM and finger feeding to baby.  Encouraged feeding 8-12 times in 24 hours and with feeding cues; feeding cues reviewed. Encouraged to not allow infant to go 4 hours without attempting to feed.  Reviewed I&O of baby and encouraged to record on yellow feeding sheet.  Lactation brochure with phone numbers left at bedside and reviewed IP/OP lactation services as well as  support group information on [email or web address]. Encouraged to call for assistance as needed.  Maternal Data Has patient been taught Hand Expression?: Yes Does the patient have breastfeeding experience prior to this delivery?: No  Feeding Feeding Type: Breast Fed  LATCH Score Latch: Repeated attempts needed to sustain latch, nipple held in mouth throughout feeding, stimulation needed to elicit sucking reflex.  Audible Swallowing: None  Type of Nipple: Everted at rest and after stimulation  Comfort (Breast/Nipple): Soft / non-tender  Hold (Positioning): Assistance needed to correctly position infant at breast and maintain latch.  LATCH Score: 6  Interventions Interventions: Breast feeding basics reviewed;Assisted with latch;Skin to skin;Breast massage;Hand express;Position options;Support pillows  Lactation Tools Discussed/Used     Consult Status Consult Status: Follow-up Date: 03/13/20 Follow-up type: In-patient    Virgia Land 03/12/2020, 2:49 PM

## 2020-03-12 NOTE — Op Note (Signed)
PreOp Diagnosis: 1) Intrauterine pregnancy @ [redacted]w[redacted]d 2) Breech presentation 3) Morbid obesity 4) Failed ECV  PostOp Diagnosis: same Procedure: Primary C-section Surgeon: Dr. Myna Hidalgo Assistant: Genice Rouge, RNFA Anesthesia: epidural Complications: none EBL: 369cc UOP: 50cc Fluids: 4700cc  Findings: Female infant from footling breech presentation.  Normal uterus, tubes and ovaries bilaterally  PROCEDURE:  Informed consent was obtained from the patient with risks, benefits, complications, treatment options, and expected outcomes discussed with the patient.  The patient concurred with the proposed plan, giving informed consent with form signed.   The patient was taken to Operating Room, and identified with the procedure verified as C-Section Delivery with Time Out. With induction of anesthesia, the patient was prepped and draped in the usual sterile fashion. A Pfannenstiel incision was made and carried down through the subcutaneous tissue to the fascia. The fascia was incised in the midline and extended transversely. The superior aspect of the fascial incision was grasped with Kochers elevated and the underlying muscle dissected off. The inferior aspect of the facial incision was in similar fashion, grasped elevated and rectus muscles dissected off. The peritoneum was identified and entered. Peritoneal incision was extended longitudinally. Alexis retractor was placed.  The utero-vesical peritoneal reflection was identified and incised transversely with the [facility] scissors, the incision extended laterally, the bladder flap created digitally. A low transverse uterine incision was made.  A foot was noted and brought to the incision along with the 2nd foot.  The infant was rotated to allow for delivery of the sacrum.  The infant was then rotated and each arm was delivered.  The head was flexed and delivered without complications.  After the umbilical cord was clamped and cut cord blood was obtained  for evaluation.   The placenta was removed intact and appeared normal. The uterine outline, tubes and ovaries appeared normal. The uterine incision was closed with running locked sutures of 0 Vicryl and a second layer of the same stitch was used in an imbricating fashion.  Excellent hemostasis was obtained.  The pericolic gutters were then cleared of all clots and debris. Interceed was placed.  The peritoneum was closed in a running fashion. The fascia was then reapproximated with running sutures of 0 Vicryl. The subcutaneous tissue was reapproximated with 2-0 plain gut suture.  The skin was closed with 4-0 vicryl in a subcuticular fashion.  Instrument, sponge, and needle counts were correct prior the abdominal closure and at the conclusion of the case. The patient was taken to recovery in stable condition.  Myna Hidalgo, DO [phone] (cell) [phone] (office)

## 2020-03-12 NOTE — Anesthesia Preprocedure Evaluation (Signed)
Anesthesia Evaluation  Patient identified by MRN, date of birth, ID band Patient awake    Reviewed: Allergy & Precautions, NPO status , Patient's Chart, lab work & pertinent test results  Airway Mallampati: III  TM Distance: >3 FB Neck ROM: Full    Dental no notable dental hx.    Pulmonary asthma ,    Pulmonary exam normal breath sounds clear to auscultation       Cardiovascular negative cardio ROS Normal cardiovascular exam Rhythm:Regular Rate:Normal     Neuro/Psych negative neurological ROS  negative psych ROS   GI/Hepatic negative GI ROS, Neg liver ROS,   Endo/Other  Morbid obesityPCOS  Renal/GU negative Renal ROS  negative genitourinary   Musculoskeletal negative musculoskeletal ROS (+)   Abdominal   Peds  Hematology negative hematology ROS (+)   Anesthesia Other Findings   Reproductive/Obstetrics (+) Pregnancy                             Anesthesia Physical Anesthesia Plan  ASA: III  Anesthesia Plan: Epidural   Post-op Pain Management:    Induction:   PONV Risk Score and Plan: 2 and Treatment may vary due to age or medical condition  Airway Management Planned: Natural Airway  Additional Equipment:   Intra-op Plan:   Post-operative Plan:   Informed Consent: I have reviewed the patients History and Physical, chart, labs and discussed the procedure including the risks, benefits and alternatives for the proposed anesthesia with the patient or authorized representative who has indicated his/her understanding and acceptance.       Plan Discussed with: Anesthesiologist  Anesthesia Plan Comments: (Patient identified. Risks, benefits, options discussed with patient including but not limited to bleeding, infection, nerve damage, paralysis, failed block, incomplete pain control, headache, blood pressure changes, nausea, vomiting, reactions to medication, itching, and post  partum back pain. Confirmed with bedside nurse the patient's most recent platelet count. Confirmed with the patient that they are not taking any anticoagulation, have any bleeding history or any family history of bleeding disorders. Patient expressed understanding and wishes to proceed. All questions were answered.   Plan to place epidural for ECV. If successful, pt will be admitted to L&D. If unsuccessful, will proceed to OR for primary C/S. )        Anesthesia Quick Evaluation

## 2020-03-12 NOTE — Transfer of Care (Signed)
Immediate Anesthesia Transfer of Care Note  Patient: Katherine Crawford  Procedure(s) Performed: CESAREAN SECTION (N/A )  Patient Location: PACU  Anesthesia Type:Epidural  Level of Consciousness: awake, alert  and oriented  Airway & Oxygen Therapy: Patient Spontanous Breathing  Post-op Assessment: Report given to RN and Post -op Vital signs reviewed and stable  Post vital signs: Reviewed and stable  Last Vitals:  Vitals Value Taken Time  BP 95/64 03/12/20 0924  Temp    Pulse 81 03/12/20 0928  Resp 15 03/12/20 0928  SpO2 98 % 03/12/20 0928  Vitals shown include unvalidated device data.  Last Pain:  Vitals:   03/12/20 0620  TempSrc:   PainSc: 0-No pain         Complications: No apparent anesthesia complications

## 2020-03-12 NOTE — Procedures (Signed)
External Cephalic Version Procedure Note  Patient Name: [name] Date of admission: 03/12/20 Date of procedure: 03/12/20  Surgeon: Dr. Myna Hidalgo Assistant: Dr. Dion Body  Procedure performed: External cephalic version   Procedure description:  After informed consent was obtained a time out was held and the procedure was verified. FHT were monitored prior to the start of the procedure and were found to be NICHD cat I. Epidural was placed to help with patient discomfort.  A BSUS was performed and verified that the infant was frank breech presentation with fetal head maternal right. The breech was disengaged from the maternal pelvis and the fetus was rotated in a forward somersault fashion. The fetus partially rotated to transverse, almost vertex presentation. FHT were assessed and during the monitoring process, the baby started to return to breech presentation.  A 2nd attempt was made in a backward motion; however, no fetal movement was noted.  FHT were intermittently monitored during the procedure and were found to be reassuring. Due to failed ECV, the plan was made to proceed with primary C-section due to fetal malpresentation.  Myna Hidalgo, DO [phone] (cell) [phone] (office)

## 2020-03-12 NOTE — Lactation Note (Signed)
This note was copied from a baby's chart. Lactation Consultation Note Baby 13 hrs old at time of consult. Mom having difficulty latching. Baby is wearing O2 canula making it difficult latching unless breast tissue is compressed. Baby sounds as if has nasal congestion.  Mom holding baby STS when LC entered room. LC assessing mom's breast tissue, hand expressing "priming line" to get baby ready to feed. Colostrum thick. Baby has a lot of wires and tubing. Mom is bothered by alarms. Placed baby in football position.  Mom very sleepy, difficulty staying awake. Baby suckling off and on, starting and stopping. No swallows heard. Encouraged mom not to feed longer than 30 minutes since having low glucose issues and don't try longer than 10 minutes to get baby to feed if not wanting to feed. NICU booklet given. Milk storage reviewed. Discussed body alignment, STS, supply and demand. Reported to RN of consult.  Patient Name: Katherine Crawford NATFT'D Date: 03/12/2020 Reason for consult: Mother's request;Difficult latch;Primapara;Term Type of Endocrine Disorder?: PCOS   Maternal Data Has patient been taught Hand Expression?: Yes Does the patient have breastfeeding experience prior to this delivery?: No  Feeding Feeding Type: Breast Fed  LATCH Score Latch: Repeated attempts needed to sustain latch, nipple held in mouth throughout feeding, stimulation needed to elicit sucking reflex.  Audible Swallowing: None  Type of Nipple: Everted at rest and after stimulation  Comfort (Breast/Nipple): Soft / non-tender  Hold (Positioning): Full assist, staff holds infant at breast  LATCH Score: 5  Interventions Interventions: Breast feeding basics reviewed;Support pillows;Assisted with latch;Position options;Skin to skin;Breast massage;Hand express;Breast compression;Adjust position  Lactation Tools Discussed/Used WIC Program: No   Consult Status Consult Status: Follow-up Date:  03/13/20 Follow-up type: In-patient    Charyl Dancer 03/12/2020, 10:43 PM

## 2020-03-12 NOTE — Progress Notes (Signed)
Pt stable. Pt transferred to Couplet Care to room with newborn. Report given to Barrington Ellison.

## 2020-03-13 ENCOUNTER — Encounter: Payer: Self-pay | Admitting: Anesthesiology

## 2020-03-13 LAB — CBC
HCT: 30.3 % — ABNORMAL LOW (ref 36.0–46.0)
Hemoglobin: 10.3 g/dL — ABNORMAL LOW (ref 12.0–15.0)
MCH: 30.5 pg (ref 26.0–34.0)
MCHC: 34 g/dL (ref 30.0–36.0)
MCV: 89.6 fL (ref 80.0–100.0)
Platelets: 205 10*3/uL (ref 150–400)
RBC: 3.38 MIL/uL — ABNORMAL LOW (ref 3.87–5.11)
RDW: 13.6 % (ref 11.5–15.5)
WBC: 9.3 10*3/uL (ref 4.0–10.5)
nRBC: 0 % (ref 0.0–0.2)

## 2020-03-13 LAB — CREATININE, SERUM
Creatinine, Ser: 0.55 mg/dL (ref 0.44–1.00)
GFR calc Af Amer: >60 mL/min (ref >60)
GFR calc non Af Amer: >60 mL/min (ref >60)

## 2020-03-13 NOTE — Progress Notes (Signed)
Postop Note Day # 1  S:  Patient resting comfortable in bed.  Pain controlled.  Tolerating general diet. No flatus, no BM.  Lochia moderate.  Some ambulation.  Foley removed this am  She denies n/v/f/c, SOB, or CP.  Pt plans on breastfeeding.  O: Temp:  [97.4 F (36.3 C)-99.6 F (37.6 C)] 98.6 F (37 C) (03/25 0625) Pulse Rate:  [61-80] 70 (03/25 0625) Resp:  [12-19] 18 (03/25 0625) BP: (82-144)/(55-90) 98/67 (03/25 0625) SpO2:  [96 %-99 %] 99 % (03/25 0625)   Gen: A&Ox3, NAD CV: RRR Resp: CTAB Abdomen: obese, soft, NT, ND +BS Uterus: firm, non-tender, below umbilicus Incision: c/d/i, bandage on Ext: No edema, no calf tenderness bilaterally, SCDs in place  Labs: CBC pending  A/P: Pt is a 34 y.o. G1P1001 s/p primary C-section due to fetal malpresentation/failed ECV, POD#1  - Pain well controlled -GU: UOP adequate, foley removed this am -GI: Tolerating general diet -Activity: encouraged sitting up to chair and ambulation as tolerated -Prophylaxis: Lovenox daily, SCDs -Labs: pending -Continue home medication: synthroid daily, glucophage XL 750mg .  May consider restarting Labetalol 100mg  bid if headaches return and BP stable -Continue with routine postop care  , DO [phone] (cell) [phone] (office)

## 2020-03-13 NOTE — Addendum Note (Signed)
Addendum  created 03/13/20 1233 by Elmer Picker, MD   Intraprocedure Event edited

## 2020-03-13 NOTE — Lactation Note (Signed)
This note was copied from a baby's chart. Lactation Consultation Note  Patient Name: Girl Paizley Ramella QASTM'H Date: 03/13/2020 Reason for consult: Follow-up assessment;NICU baby;Difficult latch;Term;Primapara Pecola Leisure is 25 hours old in the NICU.  Weight loss is 6%.  Baby has had difficulty latching.  Mom holding baby skin to skin in cross cradle hold.  Hand expression done but no colostrum seen.  Baby latched briefly a few times but falls asleep after 4-5 sucks.  Changed baby's position to football hold on right side.  Baby latched off and on for 10 minutes.  Dimpling noted with sucking and very few swallows noted.  Discussed supplementation with parents.  Mom states she would like to wait.  Explained giving baby calories may assist in baby's breastfeeding.  Instructed to continue pumping every 3 hours to stimulate milk supply.  Report given to RN.  Maternal Data    Feeding Feeding Type: Breast Fed  LATCH Score Latch: Repeated attempts needed to sustain latch, nipple held in mouth throughout feeding, stimulation needed to elicit sucking reflex.  Audible Swallowing: A few with stimulation  Type of Nipple: Everted at rest and after stimulation  Comfort (Breast/Nipple): Soft / non-tender  Hold (Positioning): Assistance needed to correctly position infant at breast and maintain latch.  LATCH Score: 7  Interventions Interventions: Breast compression;Assisted with latch;Adjust position;Skin to skin;Support pillows;Breast massage;Position options;Hand express  Lactation Tools Discussed/Used     Consult Status Consult Status: Follow-up Date: 03/14/20 Follow-up type: In-patient    Huston Foley 03/13/2020, 10:17 AM

## 2020-03-13 NOTE — Progress Notes (Signed)
Patient screened out for psychosocial assessment since none of the following apply:  Psychosocial stressors documented in mother or baby's chart  Gestation less than 32 weeks  Code at delivery   Infant with anomalies Please contact the Clinical Social Worker if specific needs arise, by MOB's request, or if MOB scores greater than 9/yes to question 10 on Edinburgh Postpartum Depression Screen.  Jerry Haugen, LCSW Clinical Social Worker [facility] Cell#: [phone]     

## 2020-03-13 NOTE — Lactation Note (Signed)
This note was copied from a baby's chart. Lactation Consultation Note  Patient Name: Katherine Crawford IPJAS'N Date: 03/13/2020 Reason for consult: Follow-up assessment;Term;Primapara;1st time breastfeeding Type of Endocrine Disorder?: PCOS  P1 mother whose infant is now 50 hours old.  This is a term baby born at 39+0 weeks.    RN requested latch assistance.  Spoke with mother briefly before latching baby to the breast.  Baby has been showing feeding cues.  Asked mother to demonstrate hand expression: technique corrected.  Father asked permission to assist mother and was able to hand express colostrum drops which I finger fed back to baby.  Encouraged mother to continue practicing hand expression and informed her that she will begin to feel more comfortable the more she practices.    Assisted baby to latch in the football hold on the left breast easily.  Once at the breast she took a few sucks and then pushed back.  Repeated a couple more times with the same results before she finally stayed latched for approximately 5 minutes.  When she became restless I showed parents how to burp effectively.  She burped and began showing cues again.  Assisted to latch to the right breast easily and she fed with gentle stimulation for another 7 minutes on this breast.  Demonstrated breast compressions and reviewed breast feeding basics while baby fed.  No apnea or bradycardia noted.  Suggested supplementing with donor milk like mother has been doing.   Encouraged mother to continue pumping for 15 minutes after every feeding.  She will feed back any EBM she obtains to baby.  Mother will call for latch assistance as needed.  Father very helpful and perceptive.  He seems to be a very good support for mother.  RN in room at the end of the feeding.  Mother felt uterine contractions during latching and became very drowsy.  Asked permission to place baby back in bassinet so she could rest.  Mother agreeable.  Father on  the phone when I left the room.   Maternal Data Formula Feeding for Exclusion: No Has patient been taught Hand Expression?: Yes Does the patient have breastfeeding experience prior to this delivery?: No  Feeding Feeding Type: Breast Fed  LATCH Score Latch: Repeated attempts needed to sustain latch, nipple held in mouth throughout feeding, stimulation needed to elicit sucking reflex.  Audible Swallowing: A few with stimulation  Type of Nipple: Everted at rest and after stimulation  Comfort (Breast/Nipple): Soft / non-tender  Hold (Positioning): Assistance needed to correctly position infant at breast and maintain latch.  LATCH Score: 7  Interventions Interventions: Breast feeding basics reviewed;Assisted with latch;Skin to skin;Breast massage;Hand express;Breast compression;Adjust position;DEBP;Position options;Support pillows  Lactation Tools Discussed/Used Breast pump type: Double-Electric Breast Pump WIC Program: No   Consult Status Consult Status: PRN Date: 03/13/20 Follow-up type: Call as needed    Irene Pap Shauntae Reitman 03/13/2020, 4:48 PM

## 2020-03-14 MED ORDER — OXYCODONE-ACETAMINOPHEN 5-325 MG PO TABS: 1.0000 | ORAL_TABLET | Freq: Four times a day (QID) | ORAL | 0 refills | Status: AC | PRN

## 2020-03-14 MED ORDER — DOCUSATE SODIUM 100 MG PO CAPS: 100.0000 mg | ORAL_CAPSULE | Freq: Two times a day (BID) | ORAL | 0 refills | Status: AC

## 2020-03-14 MED ORDER — IBUPROFEN 600 MG PO TABS: 600.0000 mg | ORAL_TABLET | Freq: Four times a day (QID) | ORAL | 0 refills | Status: AC

## 2020-03-14 NOTE — Discharge Instructions (Signed)
Cesarean Delivery, Care After This sheet gives you information about how to care for yourself after your procedure. Your health care provider may also give you more specific instructions. If you have problems or questions, contact your health care provider. What can I expect after the procedure? After the procedure, it is common to have:  A small amount of blood or clear fluid coming from the incision.  Some redness, swelling, and pain in your incision area.  Some abdominal pain and soreness.  Vaginal bleeding (lochia). Even though you did not have a vaginal delivery, you will still have vaginal bleeding and discharge.  Pelvic cramps.  Fatigue. You may have pain, swelling, and discomfort in the tissue between your vagina and your anus (perineum) if:  Your C-section was unplanned, and you were allowed to labor and push.  An incision was made in the area (episiotomy) or the tissue tore during attempted vaginal delivery. Follow these instructions at home: Incision care   Follow instructions from your health care provider about how to take care of your incision. Make sure you: ? Wash your hands with soap and water before you change your bandage (dressing). If soap and water are not available, use hand sanitizer. ? If you have a dressing, change it or remove it as told by your health care provider. ? Leave stitches (sutures), skin staples, skin glue, or adhesive strips in place. These skin closures may need to stay in place for 2 weeks or longer. If adhesive strip edges start to loosen and curl up, you may trim the loose edges. Do not remove adhesive strips completely unless your health care provider tells you to do that.  Check your incision area every day for signs of infection. Check for: ? More redness, swelling, or pain. ? More fluid or blood. ? Warmth. ? Pus or a bad smell.  Do not take baths, swim, or use a hot tub until your health care provider says it's okay. Ask your health  care provider if you can take showers.  When you cough or sneeze, hug a pillow. This helps with pain and decreases the chance of your incision opening up (dehiscing). Do this until your incision heals. Medicines  Take over-the-counter and prescription medicines only as told by your health care provider.  For pain management you may alternate between percocet and ibuprofen.  Percocet may cause constipation, be sure to continue with stool softener (colace) twice daily.  If percocet is too strong, then transition to tylenol instead.  If you were prescribed an antibiotic medicine, take it as told by your health care provider. Do not stop taking the antibiotic even if you start to feel better.  Do not drive or use heavy machinery while taking prescription pain medicine. Lifestyle  Do not drink alcohol. This is especially important if you are breastfeeding or taking pain medicine.  Do not use any products that contain nicotine or tobacco, such as cigarettes, e-cigarettes, and chewing tobacco. If you need help quitting, ask your health care provider. Eating and drinking  Drink at least 8 eight-ounce glasses of water every day unless told not to by your health care provider. If you breastfeed, you may need to drink even more water.  Eat high-fiber foods every day. These foods may help prevent or relieve constipation. High-fiber foods include: ? Whole grain cereals and breads. ? Brown rice. ? Beans. ? Fresh fruits and vegetables. Activity   If possible, have someone help you care for your baby and help  with household activities for at least a few days after you leave the hospital.  Return to your normal activities as told by your health care provider. Ask your health care provider what activities are safe for you.  Rest as much as possible. Try to rest or take a nap while your baby is sleeping.  Do not lift anything that is heavier than 10 lbs (4.5 kg), or the limit that you were told, until  your health care provider says that it is safe.  Talk with your health care provider about when you can engage in sexual activity. This may depend on your: ? Risk of infection. ? How fast you heal. ? Comfort and desire to engage in sexual activity. General instructions  Do not use tampons or douches until your health care provider approves.  Wear loose, comfortable clothing and a supportive and well-fitting bra.  Keep your perineum clean and dry. Wipe from front to back when you use the toilet.  If you pass a blood clot, save it and call your health care provider to discuss. Do not flush blood clots down the toilet before you get instructions from your health care provider.  Keep all follow-up visits for you and your baby as told by your health care provider. This is important. Contact a health care provider if:  You have: ? A fever. ? Bad-smelling vaginal discharge. ? Pus or a bad smell coming from your incision. ? Difficulty or pain when urinating. ? A sudden increase or decrease in the frequency of your bowel movements. ? More redness, swelling, or pain around your incision. ? More fluid or blood coming from your incision. ? A rash. ? Nausea. ? Little or no interest in activities you used to enjoy. ? Questions about caring for yourself or your baby.  Your incision feels warm to the touch.  Your breasts turn red or become painful or hard.  You feel unusually sad or worried.  You vomit.  You pass a blood clot from your vagina.  You urinate more than usual.  You are dizzy or light-headed. Get help right away if:  You have: ? Pain that does not go away or get better with medicine. ? Chest pain. ? Difficulty breathing. ? Blurred vision or spots in your vision. ? Thoughts about hurting yourself or your baby. ? New pain in your abdomen or in one of your legs. ? A severe headache.  You faint.  You bleed from your vagina so much that you fill more than one sanitary  pad in one hour. Bleeding should not be heavier than your heaviest period. Summary  After the procedure, it is common to have pain at your incision site, abdominal cramping, and slight bleeding from your vagina.  Check your incision area every day for signs of infection.  Tell your health care provider about any unusual symptoms.  Keep all follow-up visits for you and your baby as told by your health care provider. This information is not intended to replace advice given to you by your health care provider. Make sure you discuss any questions you have with your health care provider. Document Revised: 06/14/2018 Document Reviewed: 06/14/2018 Elsevier Patient Education  2020 [employer].

## 2020-03-14 NOTE — Lactation Note (Signed)
This note was copied from a baby's chart. 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  Baby 50 hrs old and at 7% weight loss.    Baby being supplemented with donor milk and now formula.  Mom states she is going to do "[employer]".  LC stated she didn't recommend scheduling feedings when baby's are breastfeeding due to numerous growth spurts babies have AND possible lost opportunities for baby to snack at the breast, thus increasing milk supply.  Mom states baby often fusses after a few minutes on the breast.  Mom currently pace bottle feeding baby donor milk using a purple slow flow nipple.  Suggested she place baby on the breast after 1/2 of baby's supplement or after feeding when she isn't as hungry.  DEBP at bedside.  Mom hasn't pumped regularly.  Talked about importance of pumping both breasts every 2-3 hrs during the day and 3-4 hrs at night.  Mom has not been pumping at night.  Mom states she only is getting drops.  Reassured Mom and encouraged more regular pumping.   Mom has a Spectra pump at home.  Mom made of [employer] DEBP rental in gift shop.  With Mom's health history, recommended a hospital grade DEBP to help establish the best milk supply.   Encouraged lots of STS, breast massage, hand expression and regular double pumping (>8 times per 24 hrs).  Recommended offering breast often with early cues.  Mom is interested in OP lactation follow-up, message sent to clinic.  Engorgement prevention and treatment reviewed. Mom aware of OP lactation support available to her, and encouraged to call prn.   Interventions Interventions: Breast feeding basics reviewed;Assisted with latch;Skin to skin;Breast massage;Hand express;Breast compression;Adjust  position;Support pillows;Position options;Expressed milk;DEBP  Lactation Tools Discussed/Used Tools: Pump;Bottle Breast pump type: Double-Electric Breast Pump   Consult Status Consult Status: Complete Date: 03/14/20 Follow-up type: Out-patient(request made)    [name] 03/14/2020, 11:05 AM

## 2020-03-14 NOTE — Discharge Summary (Signed)
OB Discharge Summary     Patient Name: Katherine Crawford DOB: [birth year] MRN: [id number]  Date of admission: 03/12/2020 Delivering MD: Myna Hidalgo   Date of discharge: 03/14/2020  Admitting diagnosis: Normal intrauterine pregnancy in third trimester [Z34.93] Intrauterine pregnancy: [redacted]w[redacted]d     Secondary diagnosis:  Active Problems:   Normal intrauterine pregnancy in third trimester  Additional problems: Breech presentation, Obesity, Hypothyroidism, PCOS     Discharge diagnosis: Term Pregnancy Delivered and same as above                                                                                          Post partum procedures:none  Augmentation: n/a  Complications: None  Hospital course:  Sceduled C/S   34 y.o. yo G1P1001 at [redacted]w[redacted]d was admitted to the hospital 03/12/2020.  ECV was attempted, but no successful, she then went for scheduled cesarean section with the following indication:Malpresentation.  Membrane Rupture Time/Date: 8:43 AM ,03/12/2020   Patient delivered a Viable infant.03/12/2020  Details of operation can be found in separate operative note.  Pateint had an uncomplicated postpartum course.  She is ambulating, tolerating a regular diet, passing flatus, and urinating well. Patient is discharged home in stable condition on  03/14/20         Physical exam  Vitals:   03/13/20 0625 03/13/20 1400 03/14/20 0030 03/14/20 0537  BP: 98/67 126/86 128/76 134/86  Pulse: 70 72 85 84  Resp: 18 20 18 17   Temp: 98.6 F (37 C) 98.1 F (36.7 C) 98 F (36.7 C) 98 F (36.7 C)  TempSrc: Oral Oral Oral Oral  SpO2: 99% 100% 99% 98%  Weight:      Height:       General: alert, cooperative and no distress Lochia: appropriate Uterine Fundus: firm Incision: Dressing is clean, dry, and intact DVT Evaluation: No evidence of DVT seen on physical exam. Labs: Lab Results  Component Value Date   WBC 9.3 03/13/2020   HGB 10.3 (L) 03/13/2020   HCT 30.3 (L) 03/13/2020   MCV 89.6  03/13/2020   PLT 205 03/13/2020   CMP Latest Ref Rng & Units 03/13/2020  Creatinine 0.44 - 1.00 mg/dL 03/15/2020    Discharge instruction: per After Visit Summary and "Baby and Me Booklet".  After visit meds:  Allergies as of 03/14/2020   No Known Allergies     Medication List    STOP taking these medications   acetaminophen 500 MG tablet Commonly known as: TYLENOL     TAKE these medications   albuterol 108 (90 Base) MCG/ACT inhaler Commonly known as: VENTOLIN HFA Inhale 2 puffs into the lungs every 4 (four) hours as needed for wheezing or shortness of breath.   calcium carbonate 500 MG chewable tablet Commonly known as: TUMS - dosed in mg elemental calcium Chew 1 tablet by mouth 3 (three) times daily as needed for indigestion or heartburn.   cholecalciferol 25 MCG (1000 UNIT) tablet Commonly known as: VITAMIN D3 Take 1,000 Units by mouth daily.   docusate sodium 100 MG capsule Commonly known as: Colace Take 1 capsule (100 mg total) by mouth 2 (two) times  daily.   ibuprofen 600 MG tablet Commonly known as: ADVIL Take 1 tablet (600 mg total) by mouth every 6 (six) hours.   labetalol 100 MG tablet Commonly known as: NORMODYNE Take 100 mg by mouth 2 (two) times daily.   levothyroxine 50 MCG tablet Commonly known as: SYNTHROID Take 50 mcg by mouth daily before breakfast.   metFORMIN 750 MG 24 hr tablet Commonly known as: GLUCOPHAGE-XR Take 750 mg by mouth daily with supper.   oxyCODONE-acetaminophen 5-325 MG tablet Commonly known as: Percocet Take 1-2 tablets by mouth every 6 (six) hours as needed for up to 7 days for moderate pain or severe pain.   prenatal multivitamin Tabs tablet Take 1 tablet by mouth daily at 12 noon.   SUMAtriptan 50 MG tablet Commonly known as: IMITREX Take 50 mg by mouth every 2 (two) hours as needed for migraine. May repeat in 2 hours if headache persists or recurs.       Diet: routine diet  Activity: Advance as tolerated. Pelvic  rest for 6 weeks.   Outpatient follow up:2 weeks Follow up Appt:No future appointments. Follow up Visit:No follow-ups on file.  Postpartum contraception: Undecided  Newborn Data: Live born female  Birth Weight: 6 lb 14.1 oz (3121 g) APGAR: 7, 8  Newborn Delivery   Birth date/time: 03/12/2020 08:43:00 Delivery type: C-Section, Low Transverse Trial of labor: No C-section categorization: Primary      Baby Feeding: Breast Disposition:home with mother   03/14/2020 Annalee Genta, DO

## 2020-03-14 NOTE — Progress Notes (Signed)
Postop Note Day # 2  S:  Patient resting comfortable in bed.  Pain controlled.  Tolerating general diet. + flatus, no BM.  Lochia moderate.  + ambulation. Voiding freely  She denies n/v/f/c, SOB, or CP.  Pt plans on breastfeeding.  O: Temp:  [98 F (36.7 C)-98.1 F (36.7 C)] 98 F (36.7 C) (03/26 0537) Pulse Rate:  [72-85] 84 (03/26 0537) Resp:  [17-20] 17 (03/26 0537) BP: (126-134)/(76-86) 134/86 (03/26 0537) SpO2:  [98 %-100 %] 98 % (03/26 0537)   Gen: A&Ox3, NAD CV: RRR Resp: CTAB Abdomen: obese, soft, NT, ND +BS Uterus: firm, non-tender, below umbilicus Incision: c/d/i, bandage on Ext: No edema, no calf tenderness bilaterally, SCDs in place  Labs:  CBC Latest Ref Rng & Units 03/13/2020 03/10/2020  WBC 4.0 - 10.5 K/uL 9.3 10.4  Hemoglobin 12.0 - 15.0 g/dL 10.3(L) 12.4  Hematocrit 36.0 - 46.0 % 30.3(L) 36.5  Platelets 150 - 400 K/uL 205 239     A/P: Pt is a 34 y.o. G1P1001 s/p primary C-section due to fetal malpresentation/failed ECV, POD#2  - Pain well controlled -GU: voiding freely -GI: Tolerating general diet -Activity: encouraged sitting up to chair and ambulation as tolerated -Prophylaxis: Lovenox daily, SCDs -Labs: stable as above -Continue home medication: synthroid daily, glucophage XL 750mg .  May consider restarting Labetalol 100mg  bid if headaches return and BP stable -Continue with routine postop care, consider possible discharge home pending baby's status  , DO [phone] (cell) [phone] (office)

## 2020-03-14 NOTE — Lactation Note (Signed)
This note was copied from a baby's chart. Lactation Consultation Note  Patient Name: Katherine Crawford LXBWI'O Date: 03/14/2020 Reason for consult: Follow-up assessment;NICU baby;1st time breastfeeding;Primapara;Term  LC observed Mom with baby in football hold on right breast.  Mom using "boppy pillow" but baby fallen down in hole.  Baby's latch looked good.  Pillow support added for more comfort.  Adjusted Mom to hold baby's head ear to ear and support breast at base of breast.  Showed Mom how to use alternate breast compression and baby increased her swallowing.  Mom was very happy.  FOB took pictures and a small video of baby latched deeply and sucking/swallowing well.   Feeding Feeding Type: Breast Milk Nipple Type: Nfant Slow Flow (purple)  LATCH Score Latch: Grasps breast easily, tongue down, lips flanged, rhythmical sucking.  Audible Swallowing: Spontaneous and intermittent  Type of Nipple: Everted at rest and after stimulation  Comfort (Breast/Nipple): Soft / non-tender  Hold (Positioning): Assistance needed to correctly position infant at breast and maintain latch.  LATCH Score: 9  Interventions Interventions: Breast feeding basics reviewed;Assisted with latch;Skin to skin;Breast compression;Adjust position;Support pillows;Position options;DEBP  Lactation Tools Discussed/Used Tools: Pump;Bottle Breast pump type: Double-Electric Breast Pump   Consult Status Consult Status: Complete Date: 03/14/20 Follow-up type: Out-patient    [name] 03/14/2020, 1:38 PM

## 2020-03-16 DIAGNOSIS — Z00110 Health examination for newborn under 8 days old: Secondary | ICD-10-CM | POA: Diagnosis not present

## 2020-03-16 DIAGNOSIS — R17 Unspecified jaundice: Secondary | ICD-10-CM | POA: Diagnosis not present

## 2020-03-19 ENCOUNTER — Ambulatory Visit: Payer: Self-pay

## 2020-03-19 DIAGNOSIS — R633 Feeding difficulties: Secondary | ICD-10-CM | POA: Diagnosis not present

## 2020-03-19 NOTE — Lactation Note (Addendum)
This note was copied from a baby's chart. Lactation Consultation Note  Patient Name: Katherine Crawford TIWPY'K Date: 03/19/2020    03/19/2020  Name: Katherine Crawford MRN: [id number] Date of Birth: 03/12/2019 Gestational Age: Gestational Age: [redacted]w[redacted]d Birth Weight: 110.1 oz Weight today:  Weight: 6 lb 7.6 oz (2938 g)   7 day old term infant presents today with mom and dad for feeding assessment.   Infant has gained 38 grams in the last 5 days with an average daily weight gain of 8 grams a day. Parents report lowest weight was discharge weight. Reviewed with parents that weight gain is not adequate and supplement needs to be increased and to add formula as needed. They have formula at home to supplement with as needed.   Mom reports she is having difficulty latching to the breast, she is taking the bottle well. Parents reports they are using the Similac Purple nipple and infant drools on the bottle. Infant is fighting the breast more than willing to latch. Mom would like for infant to latch to the breast.   Mom is pumping every 2 hours during the day and every 3-4 hours at night. She is getting about 40 ml per pumping. She sometimes has less. She is using the # 24 and # 28 flanges with pumping. Advised mom that based on her size, I would recommend she stay with the # 24 flange. She has some pain with pumping, she has decreased her suction to a tolerable level. Reviewed pumping for about 2 minutes after milk stops flowing.   Infant with thick labial frenulum that inserts at the bottom of the gum ridge. Upper lip tight with flanging and needs flanging on the breast. Infant with posterior lingual frenulum noted. She is able to extend and lateralize tongue. She has some decreased mid tongue elevation. She tends to hold her tongue in the roof of mouth when latching causing difficulty with latching. Infant smacks on the bottle. Will reassess at the next feeding assessment.   Reviewed with parents that  infant is not getting enough volume and weight has slowed. Reviewed that infant needs more calories as she grows. Reviewed that mom does not have enough milk reviewed that she may or may not make a full milk supply based on her medical history and that only time will tell. Reviewed infant is in need of more calories to increase her weight gain.   Reviewed mainstreaming the feeding and pumping plan to allow for more rest for mom , dad and baby. Mom is exhausted with current feeding plan.   Infant would not latch to the breast for more than a few sucks today. Mom has adequate breast tissue but infant not latching well. Decreased milk volume may be effecting her sustaining latch. Reviewed Galactagogues with mom. Mom is not a good candidate for Fenugreek due to Thyroid issues.   Infant to follow up with Dr. Laurice Record on April 9th. Infant to follow up with Lactation in 1 week.   General Information: Mother's reason for visit: Feeding assessment, difficult latch Consult: Initial Lactation consultant: Nonah Mattes RN,IBCLC Breastfeeding experience: not latching well Maternal medical conditions: Thyroid, Other, Polycystic ovarian syndrome, Infertility(Synthroid, IVF) Maternal medications: Other, Pre-natal vitamin, Motrin (ibuprofen), Tylenol (acetaminophen)(Synthroid, Labetalol (Migraines), Metformin, Vitamin D)  Breastfeeding History: Frequency of breast feeding: ever 2.5-4 hours Duration of feeding: 5-15 minutes  Supplementation: Supplement method: bottle(Similac Purple Nipple, drooling some on the nipple)         Breast milk volume: 40  ml Breast milk frequency: every 2.5-4 hours   Pump type: Spectra(S2) Pump frequency: every 2-4 hours Pump volume: 30-40 ml  Infant Output Assessment: Voids per 24 hours: 8+ Urine color: Clear yellow Stools per 24 hours: 5-7 Stool color: Yellow  Breast Assessment: Breast: Soft, Compressible Nipple: Erect Pain level: 0 Pain interventions: Bra, Breast  pump  Feeding Assessment: Infant oral assessment: Variance Infant oral assessment comment: see note Positioning: Cross cradle(left and right breasts, Psychiatrist and Football) Latch: 1 - Repeated attempts needed to sustain latch, nipple held in mouth throughout feeding, stimulation needed to elicit sucking reflex. Audible swallowing: 1 - A few with stimulation Type of nipple: 2 - Everted at rest and after stimulation Comfort: 2 - Soft/non-tender Hold: 2 - No assistance needed to correctly position infant at breast LATCH score: 8 Latch assessment: Deep Lips flanged: No(upper lip needs flanging) Suck assessment: Nonnutritive Tools: Pump Pre-feed weight: 2938 grams Post feed weight: 2940 grams Amount transferred: 2 ml Amount supplemented: parents to supplement  Additional Feeding Assessment:                                    Totals: Total amount transferred: 2 ml Total supplement given: parents did not bring milk and will supplement at home Total amount pumped post feed: 10 ml   Plan:  1. Offer infant the breast with feeding cues Feed infant at least every 3 hours or sooner if she awakens. She can have 1-4 hour stretch at night for sleeping.  Limit breast feeding to 20 minutes total 2. Stimulate infant to keep awake with feedings as needed 3. Feed infant skin to skin 4. Massage/compress breast with feeding to keep infant active at the breast 5. Offer both breasts with each feeding 6. Empty the first breast before offering the second breast 7. Offer infant the bottle of about an ounce of breast milk first and then latch to the breast 8. Offer infant a bottle of expressed breast milk after breast feeding 9. Infant needs about 54-73 ml (2-2.5 ounces) for 8 feeds a day or 435-580 ml (15-19 ounces) in 24 hours. Infant may take more or less at one feeding depending on how often she feeds. Feed infant until she is satisfied.  10. Would recommend you continue to pump  about 8 times in 24 hours for 15-20 minutes. May be helpful to to use a hands free bra and massage breasts with pumping. Pump using your double electric breast pump and pump both breasts with each feeding. Pump after infant breastfeeds. 11. [employer] has Fenugreek Products that may be helpful for milk production.  Fenugreek is not recommended for those with Thyroid or blood sugar issues 12. Keep up the good work 13. Thank you for allowing me to assist you today 14. Please call with any questions or concerns as needed [phone] 15. Follow up with Lactation in 1 week    Ed Blalock RN, IBCLC                                                      Ed Blalock 03/19/2020, 9:33 AM

## 2020-03-21 DIAGNOSIS — R300 Dysuria: Secondary | ICD-10-CM | POA: Diagnosis not present

## 2020-03-26 ENCOUNTER — Encounter: Payer: Self-pay | Admitting: Anesthesiology

## 2020-03-26 ENCOUNTER — Ambulatory Visit: Payer: Self-pay

## 2020-03-26 DIAGNOSIS — R633 Feeding difficulties: Secondary | ICD-10-CM | POA: Diagnosis not present

## 2020-03-26 NOTE — Addendum Note (Signed)
Addendum  created 03/26/20 0914 by Elmer Picker, MD   Intraprocedure Event deleted

## 2020-03-26 NOTE — Lactation Note (Addendum)
This note was copied from a baby's chart. 03/26/2020  Name: Darlena Koval MRN: [id number] Date of Birth: 03/12/2019 Gestational Age: Gestational Age: [redacted]w[redacted]d Birth Weight: 110.1 oz Weight today:  Weight: 7 lb 2.6 oz (3249 g)   General Information: Mother's reason for visit: Weight check, latch evaluation, and plan review   Lactation consultant: Edd Arbour, MSN, CNM, IBCLC) Breastfeeding experience: None Maternal medical conditions: Thyroid, Polycystic ovarian syndrome Maternal medications: Pre-natal vitamin  Breastfeeding History: Frequency of breast feeding: Very occasional, maybe once daily Duration of feeding: Less than , baby very fussy at the breast  Supplementation: Supplement method: bottle   Formula volume: At least 2oz per feeding Formula frequency: q3hr Total formula volume per day: At least 24oz Breast milk volume: 1-2oz Breast milk frequency: q3hr Total breast milk volume per day: 8-16oz Pump type: Spectra Pump frequency: q3hr Pump volume: 60-49ml  Infant Output Assessment: Voids per 24 hours: 8-12 Urine color: Clear yellow Stools per 24 hours: 8-12 Stool color: Yellow  Breast Assessment: Breast: Soft, Filling, Compressible Nipple: Erect Pain level: 0    Feeding Assessment: Infant oral assessment: WNL   Positioning: Football Latch: 0 - Too sleepy or reluctant, no latch achieved, no sucking elicited.[name] would not sustain latch without a shield) Audible swallowing: 2 - Spontaneous and intermittent Type of nipple: 2 - Everted at rest and after stimulation Comfort: 2 - Soft/non-tender Hold: 1 - Assistance needed to correctly position infant at breast and maintain latch LATCH score: 7 Latch assessment: Shallow(Able to sustain deep latch with #20 shield) Lips flanged: Yes Suck assessment: Nonnutritive Tools: Nipple shield 20 mm Pre-feed weight: 7lbs 2.6oz Post feed weight: 7lbs 3.7oz Amount transferred: 1.1oz    Lactation Note: [name]  presented with Elena in arms and her mother carrying the carseat. Khiana's parents have been visiting, but are leaving on Sunday. Bernis very concerned about her ability to keep up with pumping and bottle feeding, expressed feeling discouraged by the whole process. She wonders if she will ever make enough milk to keep up with Elena's demand.   Offered to help her put [name] to the breast. [name] very fussy and not able to latch unassisted, but maintained a strong suck on my gloved finger. Applied a #20 nipple shield and with encouragement, [name] was able to latch and relatch as needed without assistance. She nursed for on the right breast, then another on the left breast and transferred 1.1oz. Linnae was very encouraged by Elena's ability to nurse using the shield. She plans to continue trying to nurse with the shield, post-pumping and bottle feeding this week with postpartum doula help.   Patient Instructions: 1. Continue to latch with feeding cues using the nipple shield. Post-pump as needed and give bottle supplementation with hunger cues post-feedings at the breast. 2. Diannia Ruder to hire postpartum doula for additional assistance at home. 3. Follow up in 6 days for weight check and latch evaluation.  [name] MSN, CNM, IBCLC

## 2020-04-01 ENCOUNTER — Ambulatory Visit: Payer: Self-pay

## 2020-04-01 NOTE — Lactation Note (Signed)
This note was copied from a baby's chart. [birth year]20 Name: Katherine Crawford MRN: [id number] Date of Birth: 03/12/2019 Gestational Age: Gestational Age: [redacted]w[redacted]d Birth Weight: 110.1 oz Weight today:  Weight: 7 lb 6.4 oz (3357 g)  General Information: Consult: Follow-up Lactation consultant: Katherine Arbour, MSN, CNM, IBCLC) Breastfeeding experience: First time mother Maternal medical conditions: Thyroid, Polycystic ovarian syndrome Maternal medications: Pre-natal vitamin  Breastfeeding History: Frequency of breast feeding: q2-3hrs Duration of feeding: 30-45min (15-73min/side)  Supplementation: Supplement method: bottle   Formula volume: 1-1.5oz Formula frequency: 7-8x/day Total formula volume per day: 7-16oz (210-371ml)   Pump type: Medela pump in style Pump frequency: Daily Pump volume: 1-1.5oz  Infant Output Assessment: Voids per 24 hours: 8+ Urine color: Clear yellow Stools per 24 hours: 5+ Stool color: Yellow  Breast Assessment: Breast: Soft, Compressible Nipple: Erect Pain level: 1(Attempted latching on L side without shield and caused some soreness) Pain interventions: Expressed breast milk  Feeding Assessment: Infant oral assessment: WNL Infant oral assessment comment: Sucks strongly on gloved finger - starts out with some biting but swiftly got into rhythmic sucking with appropriate undulation. Positioning: Cross cradle(Latches in cross cradle, moves to cradle once latch is stable) Latch: 1 - Repeated attempts needed to sustain latch, nipple held in mouth throughout feeding, stimulation needed to elicit sucking reflex.(R/t to fussiness, she'd gotten upset during the car ride over) Audible swallowing: 1 - A few with stimulation Type of nipple: 2 - Everted at rest and after stimulation Comfort: 2 - Soft/non-tender Hold: 1 - Assistance needed to correctly position infant at breast and maintain latch LATCH score: 7 Latch assessment: Shallow Lips flanged: Yes Suck  assessment: Displays both Tools: Nipple shield 20 mm  Additional Feeding Assessment: Positioning: Cradle Latch: 2 - Grasps breast easily, tongue down, lips flanged, rhythmical sucking. Audible swallowing: 2 - Spontaneous and intermittent Type of nipple: 2 - Everted at rest and after stimulation Comfort: 2 - Soft/non-tender Hold: 2 - No assistance needed to correctly position infant at breast LATCH score: 10 Latch assessment: Deep Lips flanged: Yes Suck assessment: Nutritive Tools: Nipple shield 20 mm Pre-feed weight: 7lbs 6.4oz Post feed weight: 7lbs 7.4oz Amount transferred: 1oz   Lactation Consultation Note Katherine Crawford and husband presented with [facility] for follow up. Katherine Crawford said Katherine Crawford has been latching well at every cue-based feeding, nursing for 15-27min per side before unlatching herself. She is satisfied without supplement a couple of times per day, usually in the morning, but otherwise requires formula supplementation after feedings of 60-14ml. Katherine Crawford is ok with this routine, understanding her history of PCOS/hypothyroid may be impacting her milk supply. Katherine Crawford pumps once daily and takes a Research scientist (physical sciences) herbal blend. She was pumping more but felt that was not sustainable if on her own with Katherine Crawford.  Katherine Crawford had gotten upset during the car ride over so the first latch took a lot of encouragement, but she did well the second time with no LC assistance.   Reviewed appropriate intake amounts, and timing of feedings with them (FOB is a physician resident at [facility]). He was very engaged and helping to set realistic and achievable goals with Katherine Crawford. Katherine Crawford needs 504-637ml/day or 2.5-3.5oz per feeding. They agreed to let her nurse at the breast but offer supplement after each feeding during the day.   Follow up in 1wk for weight check.  Katherine Limbo, MSN, CNM, IBCLC 04/01/2020, 3:09 PM

## 2020-04-08 ENCOUNTER — Ambulatory Visit: Payer: Self-pay

## 2020-04-08 NOTE — Lactation Note (Signed)
This note was copied from a baby's chart. 04/08/2020  Name: Katherine Crawford MRN: [id number] Date of Birth: 03/12/2019 Gestational Age: Gestational Age: [redacted]w[redacted]d Birth Weight: 110.1 oz Weight today:  Weight: 7 lb 15.1 oz (3603 g)  General Information: Mother's reason for visit: Feeding plan discussion and weight check Consult: Follow-up Lactation consultant: Katherine Arbour, MSN, CNM, IBCLC) Breastfeeding experience: None, first time mother Maternal medical conditions: Thyroid, Polycystic ovarian syndrome Maternal medications: Pre-natal vitamin  Breastfeeding History: Frequency of breast feeding: 8-12x/day Duration of feeding: 30+ minutes  Supplementation: Supplement method: bottle   Formula volume: 60-44ml Formula frequency: each feeding Total formula volume per day: 420ml-960ml Breast milk volume: 84ml Breast milk frequency: Each feeding Total breast milk volume per day: Pump type: Medela pump in style Pump frequency: Each feeding Pump volume: 1-1.5oz  Infant Output Assessment:   Urine color: Clear yellow    Breast Assessment: Breast: Soft Nipple: Erect Pain level: 0   Feeding Assessment:  No at-breast feeding, entire feeding taken by bottle. Katherine Crawford takes the bottle with some milk leakage and clicking, but transferred 35ml in (it had been 2hrs since her last feeding).   Lactation Consultation Note Katherine Crawford presented with Katherine Crawford for a plan evaluation and weight check. Eldora reported that their week was exhausting trying to triple feed Katherine Crawford, most feedings taking 2hrs from start to finish to get enough milk in her. Midday on Sunday, Katherine Crawford started giving Katherine Crawford all feedings by bottle and plans to continue doing so as she is alone with Katherine Crawford most of the time and cannot keep up with triple feeding and her own postpartum recovery. Since switching to exclusive bottle feeding (with a combo of formula and EBM), Katherine Crawford has been sleeping better and satiated between  feedings.  Katherine Crawford has gained 8.7oz in 7 days.  Validated Katherine Crawford's frustration and discussed ways to fit pumping into her day so it is less disruptive. She plans to slowly wean down pumping, reviewed ways to do so gently to prevent mastitis. Gave encouragement that any amount of breastmilk has antibodies and choosing to give it by bottle for as long as she chooses is valid.  Follow up if desired.  Katherine Limbo, MSN, CNM, IBCLC 04/08/2020, 3:09 PM

## 2020-04-23 DIAGNOSIS — Z124 Encounter for screening for malignant neoplasm of cervix: Secondary | ICD-10-CM | POA: Diagnosis not present

## 2020-04-28 DIAGNOSIS — M654 Radial styloid tenosynovitis [de Quervain]: Secondary | ICD-10-CM | POA: Diagnosis not present

## 2020-06-07 DIAGNOSIS — Z0100 Encounter for examination of eyes and vision without abnormal findings: Secondary | ICD-10-CM | POA: Diagnosis not present
